# Patient Record
Sex: Female | Born: 1975 | Race: White | Hispanic: No | Marital: Single | Smoking: Current every day smoker
Health system: Southern US, Community
[De-identification: ages and names within clinical notes are randomized; demographics above are authoritative.]

## PROBLEM LIST (undated history)

## (undated) ENCOUNTER — Emergency Department (HOSPITAL_COMMUNITY): Admission: EM | Payer: Self-pay | Source: Home / Self Care

## (undated) DIAGNOSIS — I341 Nonrheumatic mitral (valve) prolapse: Secondary | ICD-10-CM

## (undated) DIAGNOSIS — I4891 Unspecified atrial fibrillation: Secondary | ICD-10-CM

## (undated) DIAGNOSIS — F1910 Other psychoactive substance abuse, uncomplicated: Secondary | ICD-10-CM

## (undated) DIAGNOSIS — M542 Cervicalgia: Secondary | ICD-10-CM

## (undated) DIAGNOSIS — J45909 Unspecified asthma, uncomplicated: Secondary | ICD-10-CM

## (undated) DIAGNOSIS — M549 Dorsalgia, unspecified: Secondary | ICD-10-CM

## (undated) DIAGNOSIS — G8929 Other chronic pain: Secondary | ICD-10-CM

## (undated) DIAGNOSIS — F419 Anxiety disorder, unspecified: Secondary | ICD-10-CM

## (undated) DIAGNOSIS — G43909 Migraine, unspecified, not intractable, without status migrainosus: Secondary | ICD-10-CM

## (undated) DIAGNOSIS — R569 Unspecified convulsions: Secondary | ICD-10-CM

## (undated) DIAGNOSIS — J449 Chronic obstructive pulmonary disease, unspecified: Secondary | ICD-10-CM

## (undated) HISTORY — PX: OTHER SURGICAL HISTORY: SHX169

## (undated) HISTORY — DX: Unspecified convulsions: R56.9

---

## 2001-03-02 ENCOUNTER — Emergency Department (HOSPITAL_COMMUNITY): Admission: EM | Admit: 2001-03-02 | Discharge: 2001-03-02 | Payer: Self-pay | Admitting: *Deleted

## 2002-10-10 ENCOUNTER — Inpatient Hospital Stay (HOSPITAL_COMMUNITY): Admission: RE | Admit: 2002-10-10 | Discharge: 2002-10-12 | Payer: Self-pay | Admitting: Obstetrics and Gynecology

## 2002-10-25 DIAGNOSIS — O4590 Premature separation of placenta, unspecified, unspecified trimester: Secondary | ICD-10-CM

## 2004-02-01 ENCOUNTER — Ambulatory Visit (HOSPITAL_COMMUNITY): Admission: AD | Admit: 2004-02-01 | Discharge: 2004-02-01 | Payer: Self-pay | Admitting: Obstetrics & Gynecology

## 2004-11-14 ENCOUNTER — Encounter: Payer: Self-pay | Admitting: Orthopedic Surgery

## 2004-11-30 ENCOUNTER — Encounter: Payer: Self-pay | Admitting: Orthopedic Surgery

## 2004-12-28 ENCOUNTER — Encounter: Payer: Self-pay | Admitting: Orthopedic Surgery

## 2005-01-28 ENCOUNTER — Encounter: Payer: Self-pay | Admitting: Orthopedic Surgery

## 2005-11-28 ENCOUNTER — Observation Stay (HOSPITAL_COMMUNITY): Admission: EM | Admit: 2005-11-28 | Discharge: 2005-11-29 | Payer: Self-pay | Admitting: Emergency Medicine

## 2005-11-29 ENCOUNTER — Encounter: Payer: Self-pay | Admitting: Obstetrics & Gynecology

## 2006-08-24 ENCOUNTER — Emergency Department (HOSPITAL_COMMUNITY): Admission: EM | Admit: 2006-08-24 | Discharge: 2006-08-24 | Payer: Self-pay | Admitting: Emergency Medicine

## 2007-05-02 ENCOUNTER — Emergency Department (HOSPITAL_COMMUNITY): Admission: EM | Admit: 2007-05-02 | Discharge: 2007-05-02 | Payer: Self-pay | Admitting: Emergency Medicine

## 2007-05-24 ENCOUNTER — Emergency Department (HOSPITAL_COMMUNITY): Admission: EM | Admit: 2007-05-24 | Discharge: 2007-05-24 | Payer: Self-pay | Admitting: Physician Assistant

## 2008-01-27 ENCOUNTER — Emergency Department (HOSPITAL_COMMUNITY): Admission: EM | Admit: 2008-01-27 | Discharge: 2008-01-27 | Payer: Self-pay | Admitting: Emergency Medicine

## 2008-02-16 ENCOUNTER — Encounter: Admission: RE | Admit: 2008-02-16 | Discharge: 2008-02-16 | Payer: Self-pay | Admitting: Neurosurgery

## 2009-06-13 ENCOUNTER — Emergency Department (HOSPITAL_COMMUNITY): Admission: EM | Admit: 2009-06-13 | Discharge: 2009-06-13 | Payer: Self-pay | Admitting: Emergency Medicine

## 2009-08-30 ENCOUNTER — Ambulatory Visit (HOSPITAL_COMMUNITY): Admission: RE | Admit: 2009-08-30 | Discharge: 2009-08-30 | Payer: Self-pay | Admitting: Neurology

## 2009-11-23 ENCOUNTER — Emergency Department (HOSPITAL_COMMUNITY): Admission: EM | Admit: 2009-11-23 | Discharge: 2009-11-23 | Payer: Self-pay | Admitting: Emergency Medicine

## 2009-11-25 ENCOUNTER — Inpatient Hospital Stay (HOSPITAL_COMMUNITY): Admission: AD | Admit: 2009-11-25 | Discharge: 2009-11-25 | Payer: Self-pay | Admitting: Obstetrics & Gynecology

## 2009-12-09 ENCOUNTER — Emergency Department (HOSPITAL_COMMUNITY): Admission: EM | Admit: 2009-12-09 | Discharge: 2009-12-09 | Payer: Self-pay | Admitting: Emergency Medicine

## 2010-09-25 ENCOUNTER — Inpatient Hospital Stay: Payer: Self-pay | Admitting: Obstetrics and Gynecology

## 2011-01-18 LAB — RAPID STREP SCREEN (MED CTR MEBANE ONLY): Streptococcus, Group A Screen (Direct): NEGATIVE

## 2011-03-17 NOTE — Discharge Summary (Signed)
   NAMEOSCEOLA, HOLIAN NO.:  [id number]   MEDICAL RECORD NO.:  [id number]                   PATIENT TYPE:  INP   LOCATION:  A428                                 FACILITY:  [facility]   PHYSICIAN:  Lazaro Arms, M.D.                DATE OF BIRTH:  [birth year]   DATE OF ADMISSION:  10/10/2002  DATE OF DISCHARGE:  10/12/2002                                 DISCHARGE SUMMARY   DISCHARGE DIAGNOSES:  1. Intrauterine pregnancy at [redacted] weeks gestation.  2. Marginal sinus abruption.  3. History of preterm labor.  4. Preterm labor.   HOSPITAL COURSE:  The patient was admitted with some vaginal bleeding and  contractions.  We started her on terbutaline, got laboratories.  She did not  have any significant laboratories in her laboratories.  She does have a  history of preterm labor.  She is not followed by our practice.  She is  followed at [facility].  The bleeding quit, went away to dark spotting.  She received Celestone 12 mg IM x 2.  She was discharged home on terbutaline  2.5 mg q.4h., and follow-up at [facility] was scheduled on October 16, 2002.  She was given instructions and precautions for return prior to that  time.                                               Lazaro Arms, M.D.    Loraine Maple  D:  11/18/2002  T:  11/19/2002  Job:  [id number]

## 2011-03-17 NOTE — Op Note (Signed)
NAME:  Leslie Christian, Leslie Christian NO.:  [id number]   MEDICAL RECORD NO.:  [id number]          PATIENT TYPE:  OIB   LOCATION:  A427                          FACILITY:  [facility]   PHYSICIAN:  Lazaro Arms, M.D.   DATE OF BIRTH:  [birth year]   DATE OF PROCEDURE:  11/29/2005  DATE OF DISCHARGE:                                 OPERATIVE REPORT   PREOPERATIVE DIAGNOSIS:  Missed abortion at 10 weeks.   POSTOPERATIVE DIAGNOSIS:  Missed abortion at 10 weeks.   PROCEDURE:  Cervical dilation with suction and sharp curettage.   SURGEON:  Dr. Despina Hidden.   ANESTHESIA:  Laryngeal mask airway.   FINDINGS:  The patient came to the hospital yesterday evening complaining of  bleeding and lower abdominal cramping. She was known to have an intrauterine  pregnancy. An ultrasound this morning revealed a missed AB at 10 weeks. The  cervix was not opened, and her bleeding had basically stopped. As a result,  she was brought for a D&C. Her uterus was 10-week size. There were no  abnormalities at the time of surgery.   DESCRIPTION OF OPERATION:  The patient was taken to the operating room and  placed in a supine position where she underwent laryngeal mask airway,  placed in dorsal lithotomy position, and prepped and draped in usual sterile  fashion. Grave's speculum was placed. Her cervix was grasped. The cervix was  dilated to allow passage of a 9 curved suction curet. Three passes were made  with suction curet, and a large amount of tissue and blood was returned. A  gentle sharp curettage was then performed. Good uterine cry was obtained in  all areas, and a repeat suction curetting was performed, and all tissue had  been removed. The patient was awakened from anesthesia, taken to the  recovery room in good stable condition. All counts were correct.      Lazaro Arms, M.D.  Electronically Signed     LHE/MEDQ  D:  11/29/2005  T:  11/29/2005  Job:  [id number]

## 2011-08-14 LAB — PREGNANCY, URINE: Preg Test, Ur: NEGATIVE

## 2013-06-17 ENCOUNTER — Emergency Department (HOSPITAL_COMMUNITY)
Admission: EM | Admit: 2013-06-17 | Discharge: 2013-06-18 | Disposition: A | Discharge status: Home / Self Care | Payer: Medicaid Other | Attending: Emergency Medicine | Admitting: Emergency Medicine

## 2013-06-17 ENCOUNTER — Emergency Department (HOSPITAL_COMMUNITY): Payer: Medicaid Other

## 2013-06-17 ENCOUNTER — Encounter (HOSPITAL_COMMUNITY): Payer: Self-pay

## 2013-06-17 DIAGNOSIS — R0602 Shortness of breath: Secondary | ICD-10-CM | POA: Diagnosis present

## 2013-06-17 DIAGNOSIS — T7421XA Adult sexual abuse, confirmed, initial encounter: Secondary | ICD-10-CM | POA: Insufficient documentation

## 2013-06-17 DIAGNOSIS — R072 Precordial pain: Secondary | ICD-10-CM | POA: Insufficient documentation

## 2013-06-17 DIAGNOSIS — Z8679 Personal history of other diseases of the circulatory system: Secondary | ICD-10-CM | POA: Insufficient documentation

## 2013-06-17 DIAGNOSIS — F17200 Nicotine dependence, unspecified, uncomplicated: Secondary | ICD-10-CM | POA: Insufficient documentation

## 2013-06-17 DIAGNOSIS — R079 Chest pain, unspecified: Secondary | ICD-10-CM | POA: Diagnosis present

## 2013-06-17 DIAGNOSIS — IMO0002 Reserved for concepts with insufficient information to code with codable children: Secondary | ICD-10-CM

## 2013-06-17 DIAGNOSIS — R61 Generalized hyperhidrosis: Secondary | ICD-10-CM | POA: Insufficient documentation

## 2013-06-17 DIAGNOSIS — N949 Unspecified condition associated with female genital organs and menstrual cycle: Secondary | ICD-10-CM | POA: Insufficient documentation

## 2013-06-17 DIAGNOSIS — Z91040 Latex allergy status: Secondary | ICD-10-CM | POA: Insufficient documentation

## 2013-06-17 DIAGNOSIS — H538 Other visual disturbances: Secondary | ICD-10-CM | POA: Insufficient documentation

## 2013-06-17 DIAGNOSIS — J441 Chronic obstructive pulmonary disease with (acute) exacerbation: Secondary | ICD-10-CM | POA: Insufficient documentation

## 2013-06-17 HISTORY — DX: Unspecified asthma, uncomplicated: J45.909

## 2013-06-17 HISTORY — DX: Nonrheumatic mitral (valve) prolapse: I34.1

## 2013-06-17 HISTORY — DX: Chronic obstructive pulmonary disease, unspecified: J44.9

## 2013-06-17 HISTORY — DX: Unspecified atrial fibrillation: I48.91

## 2013-06-17 LAB — URINALYSIS W MICROSCOPIC + REFLEX CULTURE
Bilirubin Urine: NEGATIVE
Glucose, UA: NEGATIVE mg/dL
Ketones, ur: NEGATIVE mg/dL
Protein, ur: NEGATIVE mg/dL
Urobilinogen, UA: 0.2 mg/dL (ref 0.0–1.0)

## 2013-06-17 LAB — CBC WITH DIFFERENTIAL/PLATELET
Basophils Relative: 0 % (ref 0–1)
Eosinophils Absolute: 0.1 10*3/uL (ref 0.0–0.7)
Eosinophils Relative: 1 % (ref 0–5)
Hemoglobin: 11.1 g/dL — ABNORMAL LOW (ref 12.0–15.0)
Lymphs Abs: 4.1 10*3/uL — ABNORMAL HIGH (ref 0.7–4.0)
MCH: 29.4 pg (ref 26.0–34.0)
MCHC: 33.6 g/dL (ref 30.0–36.0)
MCV: 87.5 fL (ref 78.0–100.0)
Monocytes Absolute: 0.9 10*3/uL (ref 0.1–1.0)
Platelets: 181 10*3/uL (ref 150–400)
RBC: 3.77 MIL/uL — ABNORMAL LOW (ref 3.87–5.11)

## 2013-06-17 LAB — COMPREHENSIVE METABOLIC PANEL
BUN: 7 mg/dL (ref 6–23)
CO2: 25 mEq/L (ref 19–32)
Chloride: 100 mEq/L (ref 96–112)
Creatinine, Ser: 0.51 mg/dL (ref 0.50–1.10)
GFR calc Af Amer: >90 mL/min (ref >90)
GFR calc non Af Amer: >90 mL/min (ref >90)
Total Bilirubin: <0.1 mg/dL — ABNORMAL LOW (ref 0.3–1.2)

## 2013-06-17 LAB — D-DIMER, QUANTITATIVE: D-Dimer, Quant: 2.38 ug/mL-FEU — ABNORMAL HIGH (ref 0.00–0.48)

## 2013-06-17 MED ORDER — SODIUM CHLORIDE 0.9 % IV BOLUS (SEPSIS)
1000.0000 mL | INTRAVENOUS | Status: AC
  Administered 2013-06-17: 1000 mL via INTRAVENOUS

## 2013-06-17 NOTE — ED Notes (Signed)
Waiting for sane nurse to asses patient

## 2013-06-17 NOTE — ED Notes (Signed)
Pt reports sharp chest pain onset 20-30 minutes ago.

## 2013-06-17 NOTE — Progress Notes (Addendum)
Call from [facility], pt presents with sharp chest pain. [id number]. No complaints of contractions. Pt states baby is breech. 2220 called spoke to RN Brenda FHR reassuring and reactive, may discontinue if ok with EDP
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
## 2013-06-17 NOTE — ED Notes (Signed)
Per Candise Bowens, RN (Rapid Response at [facility])  Pt tracing and baby look good.
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
## 2013-06-18 DIAGNOSIS — IMO0002 Reserved for concepts with insufficient information to code with codable children: Secondary | ICD-10-CM

## 2013-06-18 DIAGNOSIS — R079 Chest pain, unspecified: Secondary | ICD-10-CM | POA: Diagnosis present

## 2013-06-18 DIAGNOSIS — R0602 Shortness of breath: Secondary | ICD-10-CM | POA: Diagnosis present

## 2013-06-18 LAB — WET PREP, GENITAL

## 2013-06-18 MED ORDER — METRONIDAZOLE 500 MG PO TABS
2000.0000 mg | ORAL_TABLET | ORAL | Status: AC
  Administered 2013-06-18: 2000 mg via ORAL
  Filled 2013-06-18 (×2): qty 4

## 2013-06-18 MED ORDER — CEFTRIAXONE SODIUM 250 MG IJ SOLR
INTRAMUSCULAR | Status: AC
  Filled 2013-06-18: qty 250

## 2013-06-18 MED ORDER — CEFTRIAXONE SODIUM 250 MG IJ SOLR
250.0000 mg | INTRAMUSCULAR | Status: AC
  Administered 2013-06-18: 250 mg via INTRAMUSCULAR
  Filled 2013-06-18: qty 250

## 2013-06-18 MED ORDER — AZITHROMYCIN 1 G PO PACK
PACK | ORAL | Status: AC
  Filled 2013-06-18: qty 1

## 2013-06-18 MED ORDER — ACETAMINOPHEN 325 MG PO TABS
650.0000 mg | ORAL_TABLET | Freq: Once | ORAL | Status: AC
  Administered 2013-06-18: 650 mg via ORAL
  Filled 2013-06-18: qty 2

## 2013-06-18 MED ORDER — LIDOCAINE HCL (PF) 1 % IJ SOLN
INTRAMUSCULAR | Status: AC
  Administered 2013-06-18: 01:00:00
  Filled 2013-06-18: qty 5

## 2013-06-18 MED ORDER — AZITHROMYCIN 1 G PO PACK
1.0000 g | PACK | Freq: Once | ORAL | Status: AC
  Administered 2013-06-18: 1 g via ORAL
  Filled 2013-06-18: qty 1
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
## 2013-06-19 LAB — URINE CULTURE: Colony Count: NO GROWTH

## 2013-06-20 ENCOUNTER — Telehealth (HOSPITAL_COMMUNITY): Payer: Self-pay | Admitting: *Deleted

## 2013-06-20 NOTE — ED Notes (Signed)
+   Chlamydia Patient treated with Rocpehin and zithromax.-[facility]

## 2013-06-22 ENCOUNTER — Telehealth (HOSPITAL_COMMUNITY): Payer: Self-pay | Admitting: Emergency Medicine

## 2013-06-22 NOTE — ED Notes (Signed)
+  Chlamydia. Patient treated with Rocephin and Zithromax. [facility] faxed. 

## 2013-06-22 NOTE — ED Notes (Signed)
Patient has +Chlamydia. 

## 2013-07-01 ENCOUNTER — Other Ambulatory Visit: Payer: Self-pay | Admitting: Obstetrics & Gynecology

## 2013-07-01 DIAGNOSIS — O0933 Supervision of pregnancy with insufficient antenatal care, third trimester: Secondary | ICD-10-CM

## 2013-07-03 ENCOUNTER — Ambulatory Visit: Payer: Medicaid Other

## 2013-07-03 ENCOUNTER — Encounter: Payer: Self-pay | Admitting: Advanced Practice Midwife

## 2013-07-07 ENCOUNTER — Other Ambulatory Visit: Payer: Self-pay | Admitting: Obstetrics & Gynecology

## 2013-07-07 ENCOUNTER — Ambulatory Visit (INDEPENDENT_AMBULATORY_CARE_PROVIDER_SITE_OTHER): Payer: Medicaid Other

## 2013-07-07 ENCOUNTER — Encounter (HOSPITAL_COMMUNITY): Payer: Self-pay | Admitting: *Deleted

## 2013-07-07 ENCOUNTER — Inpatient Hospital Stay (HOSPITAL_COMMUNITY)
Admission: AD | Admit: 2013-07-07 | Discharge: 2013-07-07 | Disposition: A | Discharge status: Home / Self Care | Payer: Medicaid Other | Source: Ambulatory Visit | Attending: Obstetrics & Gynecology | Admitting: Obstetrics & Gynecology

## 2013-07-07 DIAGNOSIS — N949 Unspecified condition associated with female genital organs and menstrual cycle: Secondary | ICD-10-CM | POA: Insufficient documentation

## 2013-07-07 DIAGNOSIS — O0933 Supervision of pregnancy with insufficient antenatal care, third trimester: Secondary | ICD-10-CM

## 2013-07-07 DIAGNOSIS — O99891 Other specified diseases and conditions complicating pregnancy: Secondary | ICD-10-CM | POA: Insufficient documentation

## 2013-07-07 DIAGNOSIS — O0930 Supervision of pregnancy with insufficient antenatal care, unspecified trimester: Secondary | ICD-10-CM

## 2013-07-07 DIAGNOSIS — O4100X0 Oligohydramnios, unspecified trimester, not applicable or unspecified: Secondary | ICD-10-CM | POA: Insufficient documentation

## 2013-07-07 DIAGNOSIS — O4103X4 Oligohydramnios, third trimester, fetus 4: Secondary | ICD-10-CM

## 2013-07-07 LAB — WET PREP, GENITAL
Clue Cells Wet Prep HPF POC: NONE SEEN
Trich, Wet Prep: NONE SEEN
Yeast Wet Prep HPF POC: NONE SEEN

## 2013-07-07 MED ORDER — BETAMETHASONE SOD PHOS & ACET 6 (3-3) MG/ML IJ SUSP
12.0000 mg | Freq: Once | INTRAMUSCULAR | Status: AC
  Administered 2013-07-07: 12 mg via INTRAMUSCULAR
  Filled 2013-07-07: qty 2

## 2013-07-07 NOTE — Progress Notes (Signed)
Late to [facility], single, living IUP, frank breech position, MEAS. < DATES, AFI 5 4 cm, c/w oligohydramnios, post. Plac. Gr 1, anatomy screen incomplete, d/t fetal position and oligohydramnios, unable to see face, gender. cx not seen, bilat adn appear wnl, fetus not active at time of scan

## 2013-07-07 NOTE — MAU Note (Signed)
Has leaked off and on, pt thought it was pee.  US showed low fluid, sent in for further eval.

## 2013-07-07 NOTE — MAU Provider Note (Signed)
Chief Complaint:  ? leaking    Leslie Christian is a 37 y.o.  [phone] with IUP at [redacted]w[redacted]d presenting for ? leaking   Pt states that for the past 2 weeks, she has had occasional days with thin vaginal discharge and other days without. She was previously receiving care at an [facility] and today had a NOBV with [place] at [facility]. However, the US showed oligohydramnios with a total fluid volume of 5.4cm. She was then sent to the MAU for further evaluation for possible ROM.   Pt has a hx of 2 previous preterm deliveries. She has not been on 17-OHP with any of her pregnancies nor has she been diagnosed with a shortened cervix.  Her PNC thus far has been uncomplicated per the pt although we have no access to those records today as they have not been scanned in at [facility].    She denies any fevers, chills, nausea, vomiting, ctx, VB. She does endorse +FM.    Menstrual History: OB History   Grav Para Term Preterm Abortions TAB SAB Ect Mult Living   8 3 1 2 4 2 2   3        Patient's last menstrual period was 10/26/2012.      Past Medical History  Diagnosis Date  . Pregnant   . Asthma   . COPD (chronic obstructive pulmonary disease)   . Atrial fibrillation   . Mitral valve prolapse     Past Surgical History  Procedure Laterality Date  . Cesarean section      History reviewed. No pertinent family history.  History  Substance Use Topics  . Smoking status: Current Every Day Smoker  . Smokeless tobacco: Not on file  . Alcohol Use: No      Allergies  Allergen Reactions  . Latex Itching    Prescriptions prior to admission  Medication Sig Dispense Refill  . acetaminophen (TYLENOL) 500 MG tablet Take 1,000 mg by mouth daily as needed for pain.      . Prenatal Vit-Fe Fumarate-FA (PRENATAL MULTIVITAMIN) TABS tablet Take 1 tablet by mouth daily at 12 noon.        Review of Systems - Negative except for what is mentioned in HPI.  Physical Exam  Blood  pressure 127/80, pulse 71, temperature 97.9 F (36.6 C), temperature source Oral, resp. rate 18, height 5\' 4"  (1.626 m), weight 58.968 kg (130 lb), last menstrual period 10/26/2012. GENERAL: Well-developed, well-nourished female in no acute distress.  LUNGS: Clear to auscultation bilaterally.  HEART: Regular rate and rhythm. ABDOMEN: Soft, nontender, nondistended, gravid.  EXTREMITIES: Nontender, no edema, 2+ distal pulses. Cervical Exam: Dilatation 1cm   Effacement 50%   Station -3   SSE: negative pooling, some frothy yellow discharge in the vault  Presentation: breech on [place] today  FHT:  Baseline rate 130 bpm   Variability moderate  Accelerations present   Decelerations none Contractions: none    Labs: No results found for this or any previous visit (from the past 24 hour(s)).  Imaging Studies:  US Ob Detail + 14 Wk  07/07/2013    DETAILED SECOND TRIMESTER SONOGRAM  Leslie Christian is in the office for detailed second trimester sonogram.  She is a 37 y.o. year old G1P0 with Estimated Date of Delivery: 08/21/13  by best clinical estimate now at  [redacted]w[redacted]d weeks gestation. Thus far the  pregnancy has been uncomplicated.   GESTATION: SINGLETON  PRESENTATION: breech frank  FETAL ACTIVITY:  Heart rate         141          The fetus is inactive during scan time AMNIOTIC FLUID: The amniotic fluid volume is  abnormal - all pockets less than 2cm, 5.4   cm.  PLACENTA LOCALIZATION:  posterior GRADE 1  CERVIX:  Not seen  ADNEXA: The ovaries are normal.adn only   GESTATIONAL AGE AND  BIOMETRICS:  Gestational criteria: Estimated Date of Delivery: 08/21/13 by best  clinical estimate now at [redacted]w[redacted]d  Previous Scans:0              BIPARIETAL DIAMETER           7.6 cm         30.3 weeks  HEAD CIRCUMFERENCE           28.2 cm         31+0 weeks  ABDOMINAL CIRCUMFERENCE           29.9 cm         33+6 weeks  FEMUR LENGTH           6.3 cm         32+3 weeks                                                           AVERAGE  EGA(BY THIS SCAN):   31+6 weeks                                                 ESTIMATED FETAL WEIGHT:        2228  grams, 45 %     ANATOMICAL SURVEY                                                                             COMMENTS CEREBRAL VENTRICLES yes normal   CHOROID PLEXUS yes normal   CEREBELLUM yes normal   CISTERNA MAGNA yes normal   NUCHAL REGION yes normal   ORBITS yes normal   NASAL BONE no No seen   NOSE/LIP no Not seen   FACIAL PROFILE no Not seen   4 CHAMBERED HEART yes normal   OUTFLOW TRACTS yes normal   DIAPHRAGM yes normal   STOMACH yes normal   RENAL REGION yes normal   BLADDER yes normal   CORD INSERTION no No seen   3 VESSEL CORD yes normal   SPINE yes normal   ARMS/HANDS yes normal   LEGS/FEET yes normal   GENITALIA no Not seen  not seen        SUSPECTED ABNORMALITIES:  oligohydramnios  QUALITY OF SCAN: suboptimal   TECHNICIAN COMMENTS:  Late to [facility], single, living IUP, frank breech position, MEAS. < DATES, AFI  5 4 cm, c/w oligohydramnios, post. Plac. Gr 1, anatomy screen incomplete,  d/t fetal position and oligohydramnios, unable to see face, gender. cx not  seen, bilat adn appear wnl.      A copy of this report including all images has been saved and backed up to  a second source for retrieval if needed. All measures and details of the  anatomical scan, placentation, fluid volume and pelvic anatomy are  contained in that report.  Reggy Eye 07/07/2013 10:30 AM          Dg Chest Port 1 View  06/17/2013   *RADIOLOGY REPORT*  Clinical Data: Sudden onset of chest pain for several hours tonight.  The patient is [redacted] weeks pregnant.  PORTABLE CHEST - 1 VIEW  Comparison: 12/09/2009  Findings: Pulmonary hyperinflation with peribronchial thickening suggesting changes of asthma versus reactive airways disease. Costophrenic angles are not included within the field of view.  No focal consolidation.  No pneumothorax.  Normal heart size and pulmonary vascularity.  IMPRESSION: Pulmonary  hyperinflation with peribronchial thickening suggesting asthma or reactive airways disease.  No focal consolidation.   Original Report Authenticated By: Burman Nieves, M.D.    Assessment: Leslie Christian is  37 y.o. [phone] at [redacted]w[redacted]d presents with ? leaking  .  Plan:  1) oligohydramnios - negative fern and pool on SSE - amnisure collected and sent and negative - reassurance given that this was not rupture  2) hx of PTD - given hx of preterm delivery and currently soft, middle cervix with already a degree of effacement at 33 weeks, will give BMZ.  - BMZ #1 today at 1545 and second one set up at [facility] tomorrow at 1530 - could consider prometrium although will leave this option to the doctors at [facility] after they can review her records.   3) FWB - cat I tracing  New OB appt made again for the pt at [facility] on 07/10/13.  She was notified and will f/u then.    Astoria Condon L 9/8/20143:08 PM

## 2013-07-08 ENCOUNTER — Ambulatory Visit (INDEPENDENT_AMBULATORY_CARE_PROVIDER_SITE_OTHER): Payer: Medicaid Other | Admitting: Adult Health

## 2013-07-08 ENCOUNTER — Encounter: Payer: Self-pay | Admitting: Adult Health

## 2013-07-08 VITALS — BP 120/70 | Ht 64.0 in | Wt 132.0 lb

## 2013-07-08 DIAGNOSIS — Z331 Pregnant state, incidental: Secondary | ICD-10-CM

## 2013-07-08 DIAGNOSIS — O4700 False labor before 37 completed weeks of gestation, unspecified trimester: Secondary | ICD-10-CM

## 2013-07-08 DIAGNOSIS — Z1389 Encounter for screening for other disorder: Secondary | ICD-10-CM

## 2013-07-08 LAB — GC/CHLAMYDIA PROBE AMP: GC Probe RNA: NEGATIVE

## 2013-07-08 LAB — POCT URINALYSIS DIPSTICK
Blood, UA: NEGATIVE
Glucose, UA: NEGATIVE
Protein, UA: NEGATIVE

## 2013-07-08 LAB — OB RESULTS CONSOLE GC/CHLAMYDIA: Gonorrhea: NEGATIVE

## 2013-07-08 MED ORDER — BETAMETHASONE SOD PHOS & ACET 6 (3-3) MG/ML IJ SUSP
12.0000 mg | Freq: Once | INTRAMUSCULAR | Status: AC
  Administered 2013-07-08: 12 mg via INTRAMUSCULAR

## 2013-07-08 NOTE — Progress Notes (Signed)
Patient ID: Leslie Christian, female   DOB: January 16, 1976, 37 y.o.   MRN: [id number] Pt here today for 2nd BMZ shot. Pt states she has good fetal movement. Pt denies any bleeding or swelling. Pt denies any problems or concerns at this time. Pt given BMZ in right hip.

## 2013-07-09 ENCOUNTER — Inpatient Hospital Stay (HOSPITAL_COMMUNITY): Payer: Medicaid Other | Admitting: Anesthesiology

## 2013-07-09 ENCOUNTER — Encounter (HOSPITAL_COMMUNITY)
Admission: AD | Disposition: A | Discharge status: Home / Self Care | Payer: Self-pay | Source: Ambulatory Visit | Attending: Family Medicine

## 2013-07-09 ENCOUNTER — Encounter (HOSPITAL_COMMUNITY): Payer: Self-pay

## 2013-07-09 ENCOUNTER — Inpatient Hospital Stay (HOSPITAL_COMMUNITY): Payer: Medicaid Other

## 2013-07-09 ENCOUNTER — Inpatient Hospital Stay (HOSPITAL_COMMUNITY)
Admission: AD | Admit: 2013-07-09 | Discharge: 2013-07-12 | DRG: 765 | Disposition: A | Discharge status: Home / Self Care | Payer: Medicaid Other | Source: Ambulatory Visit | Attending: Family Medicine | Admitting: Family Medicine

## 2013-07-09 ENCOUNTER — Encounter (HOSPITAL_COMMUNITY): Payer: Self-pay | Admitting: Anesthesiology

## 2013-07-09 DIAGNOSIS — O9942 Diseases of the circulatory system complicating childbirth: Secondary | ICD-10-CM

## 2013-07-09 DIAGNOSIS — O0930 Supervision of pregnancy with insufficient antenatal care, unspecified trimester: Secondary | ICD-10-CM

## 2013-07-09 DIAGNOSIS — Z8759 Personal history of other complications of pregnancy, childbirth and the puerperium: Secondary | ICD-10-CM | POA: Insufficient documentation

## 2013-07-09 DIAGNOSIS — O360990 Maternal care for other rhesus isoimmunization, unspecified trimester, not applicable or unspecified: Secondary | ICD-10-CM | POA: Diagnosis present

## 2013-07-09 DIAGNOSIS — O0933 Supervision of pregnancy with insufficient antenatal care, third trimester: Secondary | ICD-10-CM

## 2013-07-09 DIAGNOSIS — O321XX0 Maternal care for breech presentation, not applicable or unspecified: Principal | ICD-10-CM | POA: Diagnosis present

## 2013-07-09 DIAGNOSIS — I2510 Atherosclerotic heart disease of native coronary artery without angina pectoris: Secondary | ICD-10-CM

## 2013-07-09 DIAGNOSIS — O42913 Preterm premature rupture of membranes, unspecified as to length of time between rupture and onset of labor, third trimester: Secondary | ICD-10-CM | POA: Diagnosis present

## 2013-07-09 DIAGNOSIS — O26899 Other specified pregnancy related conditions, unspecified trimester: Secondary | ICD-10-CM | POA: Diagnosis present

## 2013-07-09 DIAGNOSIS — I059 Rheumatic mitral valve disease, unspecified: Secondary | ICD-10-CM | POA: Diagnosis present

## 2013-07-09 DIAGNOSIS — O09899 Supervision of other high risk pregnancies, unspecified trimester: Secondary | ICD-10-CM

## 2013-07-09 DIAGNOSIS — O4100X0 Oligohydramnios, unspecified trimester, not applicable or unspecified: Secondary | ICD-10-CM | POA: Diagnosis present

## 2013-07-09 DIAGNOSIS — O34219 Maternal care for unspecified type scar from previous cesarean delivery: Secondary | ICD-10-CM | POA: Diagnosis present

## 2013-07-09 DIAGNOSIS — O99892 Other specified diseases and conditions complicating childbirth: Secondary | ICD-10-CM | POA: Diagnosis present

## 2013-07-09 DIAGNOSIS — A749 Chlamydial infection, unspecified: Secondary | ICD-10-CM | POA: Insufficient documentation

## 2013-07-09 DIAGNOSIS — Z22330 Carrier of Group B streptococcus: Secondary | ICD-10-CM

## 2013-07-09 DIAGNOSIS — O4290 Premature rupture of membranes, unspecified as to length of time between rupture and onset of labor, unspecified weeks of gestation: Secondary | ICD-10-CM | POA: Diagnosis present

## 2013-07-09 DIAGNOSIS — Z87898 Personal history of other specified conditions: Secondary | ICD-10-CM | POA: Insufficient documentation

## 2013-07-09 DIAGNOSIS — L299 Pruritus, unspecified: Secondary | ICD-10-CM | POA: Diagnosis present

## 2013-07-09 DIAGNOSIS — O99334 Smoking (tobacco) complicating childbirth: Secondary | ICD-10-CM | POA: Diagnosis present

## 2013-07-09 DIAGNOSIS — F1491 Cocaine use, unspecified, in remission: Secondary | ICD-10-CM | POA: Insufficient documentation

## 2013-07-09 DIAGNOSIS — Z3490 Encounter for supervision of normal pregnancy, unspecified, unspecified trimester: Secondary | ICD-10-CM

## 2013-07-09 LAB — DIFFERENTIAL
Eosinophils Relative: 0 % (ref 0–5)
Lymphocytes Relative: 17 % (ref 12–46)
Lymphs Abs: 2.8 10*3/uL (ref 0.7–4.0)
Monocytes Absolute: 0.7 10*3/uL (ref 0.1–1.0)
Monocytes Relative: 4 % (ref 3–12)

## 2013-07-09 LAB — TYPE AND SCREEN: ABO/RH(D): A NEG

## 2013-07-09 LAB — CBC
HCT: 32.8 % — ABNORMAL LOW (ref 36.0–46.0)
Hemoglobin: 11.1 g/dL — ABNORMAL LOW (ref 12.0–15.0)
MCH: 29.4 pg (ref 26.0–34.0)
MCV: 86.8 fL (ref 78.0–100.0)
RBC: 3.78 MIL/uL — ABNORMAL LOW (ref 3.87–5.11)
WBC: 16.7 10*3/uL — ABNORMAL HIGH (ref 4.0–10.5)

## 2013-07-09 LAB — RAPID URINE DRUG SCREEN, HOSP PERFORMED
Amphetamines: NOT DETECTED
Cocaine: NOT DETECTED
Opiates: NOT DETECTED
Tetrahydrocannabinol: NOT DETECTED

## 2013-07-09 LAB — URINALYSIS, ROUTINE W REFLEX MICROSCOPIC
Glucose, UA: NEGATIVE mg/dL
Protein, ur: NEGATIVE mg/dL

## 2013-07-09 LAB — RAPID HIV SCREEN (WH-MAU): Rapid HIV Screen: NONREACTIVE

## 2013-07-09 LAB — URINE MICROSCOPIC-ADD ON

## 2013-07-09 LAB — GROUP B STREP BY PCR: Group B strep by PCR: POSITIVE — AB

## 2013-07-09 LAB — ETHANOL: Alcohol, Ethyl (B): <11 mg/dL (ref 0–11)

## 2013-07-09 LAB — ABO/RH: ABO/RH(D): A NEG

## 2013-07-09 LAB — OB RESULTS CONSOLE HIV ANTIBODY (ROUTINE TESTING): HIV: NONREACTIVE

## 2013-07-09 SURGERY — Surgical Case
Anesthesia: Spinal | Site: Abdomen | Wound class: Clean Contaminated

## 2013-07-09 MED ORDER — ZOLPIDEM TARTRATE 5 MG PO TABS
5.0000 mg | ORAL_TABLET | Freq: Every evening | ORAL | Status: DC | PRN
  Administered 2013-07-10 – 2013-07-12 (×3): 5 mg via ORAL
  Filled 2013-07-09 (×3): qty 1

## 2013-07-09 MED ORDER — MENTHOL 3 MG MT LOZG: 1.0000 | LOZENGE | OROMUCOSAL | Status: DC | PRN

## 2013-07-09 MED ORDER — LANOLIN HYDROUS EX OINT: 1.0000 "application " | TOPICAL_OINTMENT | CUTANEOUS | Status: DC | PRN

## 2013-07-09 MED ORDER — CEFAZOLIN SODIUM-DEXTROSE 2-3 GM-% IV SOLR
2.0000 g | Freq: Once | INTRAVENOUS | Status: AC
  Administered 2013-07-09: 2 g via INTRAVENOUS
  Filled 2013-07-09: qty 50

## 2013-07-09 MED ORDER — MEPERIDINE HCL 25 MG/ML IJ SOLN: 6.2500 mg | INTRAMUSCULAR | Status: DC | PRN

## 2013-07-09 MED ORDER — DOCUSATE SODIUM 100 MG PO CAPS
100.0000 mg | ORAL_CAPSULE | Freq: Every day | ORAL | Status: DC
  Filled 2013-07-09: qty 1

## 2013-07-09 MED ORDER — MORPHINE SULFATE (PF) 0.5 MG/ML IJ SOLN
INTRAMUSCULAR | Status: DC | PRN
  Administered 2013-07-09: 200 ug via INTRATHECAL

## 2013-07-09 MED ORDER — PROMETHAZINE HCL 25 MG/ML IJ SOLN: 6.2500 mg | INTRAMUSCULAR | Status: DC | PRN

## 2013-07-09 MED ORDER — ONDANSETRON HCL 4 MG/2ML IJ SOLN: 4.0000 mg | INTRAMUSCULAR | Status: DC | PRN

## 2013-07-09 MED ORDER — MEPERIDINE HCL 25 MG/ML IJ SOLN
INTRAMUSCULAR | Status: DC | PRN
  Administered 2013-07-09 (×2): 12.5 mg via INTRAVENOUS

## 2013-07-09 MED ORDER — DIPHENHYDRAMINE HCL 50 MG/ML IJ SOLN
25.0000 mg | INTRAMUSCULAR | Status: DC | PRN
  Administered 2013-07-09: 25 mg via INTRAMUSCULAR

## 2013-07-09 MED ORDER — SENNOSIDES-DOCUSATE SODIUM 8.6-50 MG PO TABS
2.0000 | ORAL_TABLET | Freq: Every day | ORAL | Status: DC
  Administered 2013-07-10 – 2013-07-11 (×2): 2 via ORAL

## 2013-07-09 MED ORDER — FENTANYL CITRATE 0.05 MG/ML IJ SOLN
INTRAMUSCULAR | Status: DC | PRN
  Administered 2013-07-09: 25 ug via INTRATHECAL

## 2013-07-09 MED ORDER — SODIUM CHLORIDE 0.9 % IV SOLN
250.0000 mg | Freq: Four times a day (QID) | INTRAVENOUS | Status: DC
  Filled 2013-07-09 (×2): qty 250

## 2013-07-09 MED ORDER — KETOROLAC TROMETHAMINE 60 MG/2ML IM SOLN
60.0000 mg | Freq: Once | INTRAMUSCULAR | Status: AC | PRN
  Administered 2013-07-09: 60 mg via INTRAMUSCULAR

## 2013-07-09 MED ORDER — ONDANSETRON HCL 4 MG PO TABS: 4.0000 mg | ORAL_TABLET | ORAL | Status: DC | PRN

## 2013-07-09 MED ORDER — WITCH HAZEL-GLYCERIN EX PADS: 1.0000 "application " | MEDICATED_PAD | CUTANEOUS | Status: DC | PRN

## 2013-07-09 MED ORDER — LACTATED RINGERS IV SOLN
INTRAVENOUS | Status: DC | PRN
  Administered 2013-07-09 (×2): via INTRAVENOUS

## 2013-07-09 MED ORDER — PRENATAL MULTIVITAMIN CH
1.0000 | ORAL_TABLET | Freq: Every day | ORAL | Status: DC
  Filled 2013-07-09: qty 1

## 2013-07-09 MED ORDER — ERYTHROMYCIN BASE 250 MG PO TABS: 250.0000 mg | ORAL_TABLET | Freq: Four times a day (QID) | ORAL | Status: DC

## 2013-07-09 MED ORDER — NALOXONE HCL 0.4 MG/ML IJ SOLN: 0.4000 mg | INTRAMUSCULAR | Status: DC | PRN

## 2013-07-09 MED ORDER — BUPIVACAINE IN DEXTROSE 0.75-8.25 % IT SOLN
INTRATHECAL | Status: DC | PRN
  Administered 2013-07-09: 9 mg via INTRATHECAL

## 2013-07-09 MED ORDER — SIMETHICONE 80 MG PO CHEW: 80.0000 mg | CHEWABLE_TABLET | ORAL | Status: DC | PRN

## 2013-07-09 MED ORDER — DIPHENHYDRAMINE HCL 25 MG PO CAPS
25.0000 mg | ORAL_CAPSULE | ORAL | Status: DC | PRN
  Administered 2013-07-09 – 2013-07-10 (×3): 25 mg via ORAL
  Filled 2013-07-09 (×5): qty 1

## 2013-07-09 MED ORDER — NICOTINE 7 MG/24HR TD PT24
7.0000 mg | MEDICATED_PATCH | Freq: Every day | TRANSDERMAL | Status: DC | PRN
  Filled 2013-07-09: qty 1

## 2013-07-09 MED ORDER — KETOROLAC TROMETHAMINE 30 MG/ML IJ SOLN
30.0000 mg | Freq: Four times a day (QID) | INTRAMUSCULAR | Status: AC | PRN
  Administered 2013-07-09: 30 mg via INTRAVENOUS

## 2013-07-09 MED ORDER — SCOPOLAMINE 1 MG/3DAYS TD PT72
1.0000 | MEDICATED_PATCH | Freq: Once | TRANSDERMAL | Status: AC
  Administered 2013-07-09: 1.5 mg via TRANSDERMAL

## 2013-07-09 MED ORDER — FAMOTIDINE IN NACL 20-0.9 MG/50ML-% IV SOLN
20.0000 mg | Freq: Once | INTRAVENOUS | Status: AC
  Administered 2013-07-09: 20 mg via INTRAVENOUS
  Filled 2013-07-09: qty 50

## 2013-07-09 MED ORDER — SODIUM CHLORIDE 0.9 % IJ SOLN: 3.0000 mL | INTRAMUSCULAR | Status: DC | PRN

## 2013-07-09 MED ORDER — DEXTROSE IN LACTATED RINGERS 5 % IV SOLN
INTRAVENOUS | Status: DC
  Administered 2013-07-09 (×2): via INTRAVENOUS

## 2013-07-09 MED ORDER — IBUPROFEN 600 MG PO TABS
600.0000 mg | ORAL_TABLET | Freq: Four times a day (QID) | ORAL | Status: DC
  Administered 2013-07-09 – 2013-07-12 (×11): 600 mg via ORAL
  Filled 2013-07-09 (×11): qty 1

## 2013-07-09 MED ORDER — HYDROMORPHONE HCL PF 1 MG/ML IJ SOLN: 0.2500 mg | INTRAMUSCULAR | Status: DC | PRN

## 2013-07-09 MED ORDER — METOCLOPRAMIDE HCL 5 MG/ML IJ SOLN: 10.0000 mg | Freq: Three times a day (TID) | INTRAMUSCULAR | Status: DC | PRN

## 2013-07-09 MED ORDER — LACTATED RINGERS IV BOLUS (SEPSIS)
1000.0000 mL | Freq: Once | INTRAVENOUS | Status: AC
  Administered 2013-07-09: 1000 mL via INTRAVENOUS

## 2013-07-09 MED ORDER — PNEUMOCOCCAL VAC POLYVALENT 25 MCG/0.5ML IJ INJ
0.5000 mL | INJECTION | INTRAMUSCULAR | Status: AC
  Administered 2013-07-11: 0.5 mL via INTRAMUSCULAR
  Filled 2013-07-09: qty 0.5

## 2013-07-09 MED ORDER — BUPIVACAINE HCL (PF) 0.25 % IJ SOLN
INTRAMUSCULAR | Status: DC | PRN
  Administered 2013-07-09: 30 mL

## 2013-07-09 MED ORDER — CALCIUM CARBONATE ANTACID 500 MG PO CHEW
2.0000 | CHEWABLE_TABLET | ORAL | Status: DC | PRN
  Filled 2013-07-09: qty 2

## 2013-07-09 MED ORDER — SCOPOLAMINE 1 MG/3DAYS TD PT72
MEDICATED_PATCH | TRANSDERMAL | Status: AC
  Administered 2013-07-09: 1.5 mg via TRANSDERMAL
  Filled 2013-07-09: qty 1

## 2013-07-09 MED ORDER — MEASLES, MUMPS & RUBELLA VAC ~~LOC~~ INJ: 0.5000 mL | INJECTION | Freq: Once | SUBCUTANEOUS | Status: DC

## 2013-07-09 MED ORDER — SODIUM CHLORIDE 0.9 % IV SOLN
2.0000 g | Freq: Four times a day (QID) | INTRAVENOUS | Status: DC
  Filled 2013-07-09 (×3): qty 2000

## 2013-07-09 MED ORDER — TETANUS-DIPHTH-ACELL PERTUSSIS 5-2.5-18.5 LF-MCG/0.5 IM SUSP
0.5000 mL | Freq: Once | INTRAMUSCULAR | Status: AC
  Administered 2013-07-10: 0.5 mL via INTRAMUSCULAR

## 2013-07-09 MED ORDER — OXYCODONE-ACETAMINOPHEN 5-325 MG PO TABS
1.0000 | ORAL_TABLET | ORAL | Status: DC | PRN
  Administered 2013-07-09 – 2013-07-12 (×13): 2 via ORAL
  Filled 2013-07-09 (×13): qty 2

## 2013-07-09 MED ORDER — DIPHENHYDRAMINE HCL 50 MG/ML IJ SOLN: 12.5000 mg | INTRAMUSCULAR | Status: DC | PRN

## 2013-07-09 MED ORDER — DIBUCAINE 1 % RE OINT: 1.0000 "application " | TOPICAL_OINTMENT | RECTAL | Status: DC | PRN

## 2013-07-09 MED ORDER — KETOROLAC TROMETHAMINE 30 MG/ML IJ SOLN: 15.0000 mg | Freq: Once | INTRAMUSCULAR | Status: DC | PRN

## 2013-07-09 MED ORDER — OXYTOCIN 40 UNITS IN LACTATED RINGERS INFUSION - SIMPLE MED: 62.5000 mL/h | INTRAVENOUS | Status: AC

## 2013-07-09 MED ORDER — ACETAMINOPHEN 325 MG PO TABS: 650.0000 mg | ORAL_TABLET | ORAL | Status: DC | PRN

## 2013-07-09 MED ORDER — DIPHENHYDRAMINE HCL 25 MG PO CAPS
25.0000 mg | ORAL_CAPSULE | Freq: Four times a day (QID) | ORAL | Status: DC | PRN
  Administered 2013-07-10 – 2013-07-12 (×6): 25 mg via ORAL
  Filled 2013-07-09 (×5): qty 1

## 2013-07-09 MED ORDER — KETOROLAC TROMETHAMINE 30 MG/ML IJ SOLN
30.0000 mg | Freq: Four times a day (QID) | INTRAMUSCULAR | Status: AC | PRN
  Filled 2013-07-09: qty 1

## 2013-07-09 MED ORDER — NALBUPHINE HCL 10 MG/ML IJ SOLN
5.0000 mg | INTRAMUSCULAR | Status: DC | PRN
  Administered 2013-07-09: 10 mg via INTRAVENOUS
  Filled 2013-07-09 (×2): qty 1

## 2013-07-09 MED ORDER — ZOLPIDEM TARTRATE 5 MG PO TABS: 5.0000 mg | ORAL_TABLET | Freq: Every evening | ORAL | Status: DC | PRN

## 2013-07-09 MED ORDER — ONDANSETRON HCL 4 MG/2ML IJ SOLN
INTRAMUSCULAR | Status: DC | PRN
  Administered 2013-07-09: 4 mg via INTRAVENOUS

## 2013-07-09 MED ORDER — AMOXICILLIN 500 MG PO CAPS: 500.0000 mg | ORAL_CAPSULE | Freq: Three times a day (TID) | ORAL | Status: DC

## 2013-07-09 MED ORDER — NALBUPHINE HCL 10 MG/ML IJ SOLN
5.0000 mg | INTRAMUSCULAR | Status: DC | PRN
  Filled 2013-07-09: qty 1

## 2013-07-09 MED ORDER — OXYTOCIN 10 UNIT/ML IJ SOLN
40.0000 [IU] | INTRAVENOUS | Status: DC | PRN
  Administered 2013-07-09: 40 [IU] via INTRAVENOUS

## 2013-07-09 MED ORDER — DEXTROSE 5 % IV SOLN
1.0000 ug/kg/h | INTRAVENOUS | Status: DC | PRN
  Filled 2013-07-09: qty 2

## 2013-07-09 MED ORDER — KETOROLAC TROMETHAMINE 60 MG/2ML IM SOLN
INTRAMUSCULAR | Status: AC
  Administered 2013-07-09: 60 mg via INTRAMUSCULAR
  Filled 2013-07-09: qty 2

## 2013-07-09 MED ORDER — DIPHENHYDRAMINE HCL 50 MG/ML IJ SOLN
INTRAMUSCULAR | Status: AC
  Administered 2013-07-09: 25 mg via INTRAMUSCULAR
  Filled 2013-07-09: qty 1

## 2013-07-09 MED ORDER — SIMETHICONE 80 MG PO CHEW
80.0000 mg | CHEWABLE_TABLET | Freq: Three times a day (TID) | ORAL | Status: DC
  Administered 2013-07-09 – 2013-07-12 (×10): 80 mg via ORAL

## 2013-07-09 MED ORDER — PRENATAL MULTIVITAMIN CH
1.0000 | ORAL_TABLET | Freq: Every day | ORAL | Status: DC
  Administered 2013-07-09 – 2013-07-12 (×4): 1 via ORAL
  Filled 2013-07-09 (×4): qty 1

## 2013-07-09 MED ORDER — CITRIC ACID-SODIUM CITRATE 334-500 MG/5ML PO SOLN
30.0000 mL | Freq: Once | ORAL | Status: AC
  Administered 2013-07-09: 30 mL via ORAL
  Filled 2013-07-09: qty 15

## 2013-07-09 MED ORDER — ONDANSETRON HCL 4 MG/2ML IJ SOLN: 4.0000 mg | Freq: Three times a day (TID) | INTRAMUSCULAR | Status: DC | PRN

## 2013-07-09 SURGICAL SUPPLY — 34 items
CLAMP CORD UMBIL (MISCELLANEOUS) IMPLANT
CLEANER TIP ELECTROSURG 2X2 (MISCELLANEOUS) ×2 IMPLANT
CLOTH BEACON ORANGE TIMEOUT ST (SAFETY) ×2 IMPLANT
DEVICE BLD TRNS LUER ATTCH (MISCELLANEOUS) ×2 IMPLANT
DRAPE LG THREE QUARTER DISP (DRAPES) ×4 IMPLANT
DRSG OPSITE POSTOP 4X10 (GAUZE/BANDAGES/DRESSINGS) ×2 IMPLANT
DURAPREP 26ML APPLICATOR (WOUND CARE) ×2 IMPLANT
ELECT REM PT RETURN 9FT ADLT (ELECTROSURGICAL) ×2
ELECTRODE REM PT RTRN 9FT ADLT (ELECTROSURGICAL) ×1 IMPLANT
EXTRACTOR VACUUM M CUP 4 TUBE (SUCTIONS) IMPLANT
GLOVE BIOGEL PI IND STRL 7.0 (GLOVE) ×1 IMPLANT
GLOVE BIOGEL PI INDICATOR 7.0 (GLOVE) ×1
GLOVE ECLIPSE 7.0 STRL STRAW (GLOVE) ×4 IMPLANT
GOWN PREVENTION PLUS XLARGE (GOWN DISPOSABLE) ×2 IMPLANT
GOWN STRL REIN XL XLG (GOWN DISPOSABLE) ×2 IMPLANT
KIT ABG SYR 3ML LUER SLIP (SYRINGE) IMPLANT
NDL HYPO 25X5/8 SAFETYGLIDE (NEEDLE) IMPLANT
NEEDLE HYPO 22GX1.5 SAFETY (NEEDLE) ×2 IMPLANT
NEEDLE HYPO 25X5/8 SAFETYGLIDE (NEEDLE) IMPLANT
NS IRRIG 1000ML POUR BTL (IV SOLUTION) ×2 IMPLANT
PACK C SECTION WH (CUSTOM PROCEDURE TRAY) ×2 IMPLANT
PAD ABD 7.5X8 STRL (GAUZE/BANDAGES/DRESSINGS) ×1 IMPLANT
PAD OB MATERNITY 4.3X12.25 (PERSONAL CARE ITEMS) ×2 IMPLANT
PENCIL BUTTON HOLSTER BLD 10FT (ELECTRODE) ×2 IMPLANT
RTRCTR C-SECT PINK 25CM LRG (MISCELLANEOUS) ×1 IMPLANT
STAPLER VISISTAT 35W (STAPLE) IMPLANT
SUT VIC AB 0 CTX 36 (SUTURE) ×6
SUT VIC AB 0 CTX36XBRD ANBCTRL (SUTURE) ×3 IMPLANT
SUT VIC AB 4-0 KS 27 (SUTURE) IMPLANT
SYR 30ML LL (SYRINGE) ×2 IMPLANT
TAPE CLOTH SURG 4X10 WHT LF (GAUZE/BANDAGES/DRESSINGS) ×1 IMPLANT
TOWEL OR 17X24 6PK STRL BLUE (TOWEL DISPOSABLE) ×2 IMPLANT
TRAY FOLEY CATH 14FR (SET/KITS/TRAYS/PACK) ×2 IMPLANT
WATER STERILE IRR 1000ML POUR (IV SOLUTION) ×1 IMPLANT

## 2013-07-09 NOTE — Anesthesia Procedure Notes (Signed)
Spinal  Patient location during procedure: OR Start time: 07/09/2013 5:41 AM End time: 07/09/2013 5:43 AM Staffing Anesthesiologist: Sandrea Hughs Performed by: anesthesiologist  Preanesthetic Checklist Completed: patient identified, surgical consent, pre-op evaluation, timeout performed, IV checked, risks and benefits discussed and monitors and equipment checked Spinal Block Patient position: sitting Prep: DuraPrep Patient monitoring: heart rate, cardiac monitor, continuous pulse ox and blood pressure Approach: midline Location: L3-4 Injection technique: single-shot Needle Needle type: Pencan  Needle gauge: 24 G Needle length: 9 cm Needle insertion depth: 5 cm Assessment Sensory level: T4

## 2013-07-09 NOTE — H&P (Signed)
Chart reviewed and agree with management and plan. By earliest U/S, she appears to be 34 6/7 wks, ROM and MSF and breech with prior C-section.  She is latex allergic and has h/o substance abuse, including alcohol use earlier this week and + cocaine x 2 this pregnancy. Risks include but are not limited to bleeding, infection, injury to surrounding structures, including bowel, bladder and ureters, blood clots, and death.  Likelihood of success is high.
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
## 2013-07-09 NOTE — Progress Notes (Signed)
Received call that patient would like a nicotine patch. Patient smokes 1ppd. I went up to see the patient and counseled her on the effect on nicotine on breast milk and delivery to the infant. She is aware of the possible complications, including SIDS, and has decided to not use the nicotine patch at this time. I ordered nicotine patch 7mg  daily prn for nicotine cravings if she changes her mind.

## 2013-07-09 NOTE — H&P (Signed)
Chart reviewed and agree with management and plan.  

## 2013-07-09 NOTE — MAU Note (Signed)
Pt states ROM at 1130pm. States the color was clear but turned greenish. UC every 15 mins. States good FM.

## 2013-07-09 NOTE — Anesthesia Postprocedure Evaluation (Signed)
  Anesthesia Post-op Note  Patient: Leslie Christian  Procedure(s) Performed: Procedure(s): CESAREAN SECTION (N/A)  Patient Location: Women's Unit  Anesthesia Type:Spinal  Level of Consciousness: awake, alert , oriented and patient cooperative  Airway and Oxygen Therapy: Patient Spontanous Breathing  Post-op Pain: moderate per patient; received  pain medicaition from floor nurse approximately 1 our ago; nurse notified of patient request for additional medication.  Post-op Assessment: Patient's Cardiovascular Status Stable, Respiratory Function Stable, Patent Airway, No signs of Nausea or vomiting, Adequate PO intake and Pain level controlled  Post-op Vital Signs: Reviewed and stable  Complications: No apparent anesthesia complications

## 2013-07-09 NOTE — Lactation Note (Signed)
This note was copied from the chart of Leslie Christian. Lactation Consultation Note   Follow up consult with this mom of a 34 6/[redacted] week gestation baby, in the nicu, now 11 hours post partum. Mom admitts to smoking marijuana a couple of days prior to the baby being born. Mom's urine was negative for any drugs.  Neonatologist Dr. Algernon Huxley suggested I start mom pumping with DEP, and have mom pump and dump for 24 hours, which mom is fine with. i had mom begin pumping in premie settting, and showed her how to hand express. She was able to express a few drops of colostrum, which mom applied to her nipples. I did teaching with mom on providing breast milk for her baby, from the NICU book on EBM in the NICU. Hand expression taught, and mom return demonstrated with good technique. Mom is in a lot of pain, and I told her to do her best with pumping tonight, but  Self care and sleep is important for now. i told her I would follow up with her tomorrow.   Patient Name: Leslie Stephanye Finnicum [id number] Date: 07/09/2013 Reason for consult: Initial assessment;NICU baby;Late preterm infant   Maternal Data Formula Feeding for Exclusion: Yes (baby in NICU) Infant to breast within first hour of birth: No Breastfeeding delayed due to:: Infant status Has patient been taught Hand Expression?: Yes Does the patient have breastfeeding experience prior to this delivery?: Yes  Feeding    LATCH Score/Interventions                      Lactation Tools Discussed/Used Tools: Pump WIC Program: Yes [employer] county - knows to call for DEP) Initiated by:: c Nurse, children's LC, at 11 hours post partum. We did not start her earlier, due to her cocain histroy, until a decision was made to pup and sump for 24 hours  Date initiated:: 07/09/13 (at 1730)   Consult Status Consult Status: Follow-up Date: 07/10/13 Follow-up type: In-patient    Alfred Levins 07/09/2013, 5:53 PM

## 2013-07-09 NOTE — Anesthesia Preprocedure Evaluation (Addendum)
Anesthesia Evaluation  Patient identified by MRN, date of birth, ID band Patient awake    Reviewed: Allergy & Precautions, H&P , NPO status , Patient's Chart, lab work & pertinent test results  Airway Mallampati: I TM Distance: >3 FB Neck ROM: full    Dental no notable dental hx.    Pulmonary Current Smoker,    Pulmonary exam normal       Cardiovascular negative cardio ROS   MVP   Neuro/Psych negative neurological ROS  negative psych ROS   GI/Hepatic negative GI ROS, Neg liver ROS,   Endo/Other  negative endocrine ROS  Renal/GU negative Renal ROS     Musculoskeletal negative musculoskeletal ROS (+)   Abdominal Normal abdominal exam  (+)   Peds  Hematology negative hematology ROS (+)   Anesthesia Other Findings   Reproductive/Obstetrics (+) Pregnancy                          Anesthesia Physical Anesthesia Plan  ASA: II and emergent  Anesthesia Plan: Spinal   Post-op Pain Management:    Induction:   Airway Management Planned:   Additional Equipment:   Intra-op Plan:   Post-operative Plan:   Informed Consent: I have reviewed the patients History and Physical, chart, labs and discussed the procedure including the risks, benefits and alternatives for the proposed anesthesia with the patient or authorized representative who has indicated his/her understanding and acceptance.     Plan Discussed with: CRNA and Surgeon  Anesthesia Plan Comments:         Anesthesia Quick Evaluation

## 2013-07-09 NOTE — Anesthesia Postprocedure Evaluation (Deleted)
Anesthesia Post Note  Patient: Leslie Christian  Procedure(s) Performed: Procedure(s) (LRB): CESAREAN SECTION (N/A)  Anesthesia type: General  Patient location: PACU  Post pain: Pain level controlled  Post assessment: Post-op Vital signs reviewed  Last Vitals:  Filed Vitals:   07/09/13 0730  BP: 122/66  Pulse: 49  Temp: 36.7 C  Resp: 20    Post vital signs: Reviewed  Level of consciousness: sedated  Complications: No apparent anesthesia complications

## 2013-07-09 NOTE — Op Note (Signed)
Preoperative Diagnosis:  IUP @ [redacted]w[redacted]d, PPROM, Breech presentation, Meconium Stained Fluid, insufficient prenatal care, maternal drug use  Postoperative Diagnosis:  Same  Procedure: Repeat low transverse cesarean section  Surgeon: Tinnie Gens, M.D.  Assistant: None  Findings: Viable female infant, APGAR (1 MIN): 8   APGAR (5 MINS): 9  Weight 4 lb 10 ounces, breech presentation  Estimated blood loss: 1000 cc  Complications: None known  Specimens: Placenta to pathology  Reason for procedure: Briefly, the patient is a 37 y.o. [id number] [redacted]w[redacted]d who presents for ruptured membranes.  Known breech presentation with meconium stained fluid.  She is poorly dated by 28 wk U/S in [place].  She has a previous C-section for abruption.  She does not desire sterilization.  Has h/o cocaine use during pregnancy and very limited prenatal care.  Procedure: Patient is a to the OR where spinal analgesia was administered. She was then placed in a supine position with left lateral tilt. She received 2 g of Ancef and SCDs were in place. A timeout was performed. She was prepped and draped in the usual sterile fashion. A Foley catheter was placed in the bladder. A knife was then used to make a Pfannenstiel incision. This incision was carried out to underlying fascia which was divided in the midline with the knife. The incision was extended laterally, sharply.  The rectus was divided in the midline.  The peritoneal cavity was entered bluntly.  Alexis retractor was placed inside the incision.  A knife was used to make a low transverse incision on the uterus. This incision was carried down to the amniotic cavity was entered. Fetus was in breech presentation and was brought up out of the incision without difficulty. Cord was clamped x 2 and cut. Infant taken to waiting pediatrician.  Cord blood was obtained. Placenta was delivered from the uterus.  Uterus was cleaned with dry lap pads. Uterine incision closed with 0 Vicryl suture in  a locked running fashion. A second layer of 0 Vicryl in an imbricating fashion was used to achieve hemostasis. Alexis retractor was removed from the abdomen. Peritoneal closure was done with 0 Vicryl suture.  Fascia is closed with 0 Vicryl suture in a running fashion. Subcutaneous tissue infused with 30cc 0.25% Marcaine.  Skin closed using 3-0 Vicryl on a Keith needle.  Steri strips applied, followed by pressure dressing.  All instrument, needle and lap counts were correct x 2.  Patient was awake and taken to PACU stable.  Infant to Newborn Nursery, stable.   Jrue Jarriel SMD 07/09/2013 6:34 AM

## 2013-07-09 NOTE — MAU Provider Note (Signed)
Chart reviewed and agree with management and plan.  

## 2013-07-09 NOTE — Anesthesia Postprocedure Evaluation (Signed)
Anesthesia Post Note  Patient: Leslie Christian  Procedure(s) Performed: Procedure(s) (LRB): CESAREAN SECTION (N/A)  Anesthesia type: Spinal  Patient location: PACU  Post pain: Pain level controlled  Post assessment: Post-op Vital signs reviewed  Last Vitals:  Filed Vitals:   07/09/13 0730  BP: 122/66  Pulse: 49  Temp: 36.7 C  Resp: 20    Post vital signs: Reviewed  Level of consciousness: awake  Complications: No apparent anesthesia complications

## 2013-07-09 NOTE — Transfer of Care (Signed)
Immediate Anesthesia Transfer of Care Note  Patient: Leslie Christian  Procedure(s) Performed: Procedure(s): CESAREAN SECTION (N/A)  Patient Location: PACU  Anesthesia Type:Spinal  Level of Consciousness: awake, alert  and oriented  Airway & Oxygen Therapy: Patient Spontanous Breathing  Post-op Assessment: Report given to PACU RN and Post -op Vital signs reviewed and stable  Post vital signs: Reviewed and stable  Complications: No apparent anesthesia complications

## 2013-07-09 NOTE — Progress Notes (Signed)
Ur chart review completed.  

## 2013-07-09 NOTE — H&P (Addendum)
HPI: Leslie Christian is a 37 y.o. year old [phone] female at [redacted] weeks gestation by 28 week ultrasound who presents to MAU reporting leaking green amniotic fluid since 2330. Pt states she went to a few hospital visits in [place] during the pregnancy, had one visit at [facility] and had one visit at [facility] 07/07/2013. She was sent to MAU from that visit due to Oligohydramnios and did not have a complete OB visit. Records not available from any prenatal care except for [facility]. She received BMZ x 2 07/07/2013 and afternoon of 07/08/2013.  Frank breech presentation per BS [place] in MAU.   Records received from [facility] in [place]. [place], [place]. EGA 34.6  w/ EDD 08/14/2013 by 28 week [place] at  [facility] Cocaine x 2.   Maternal Medical History:  Reason for admission: Rupture of membranes.     OB History   Grav Para Term Preterm Abortions TAB SAB Ect Mult Living   8 3 1 2 4 2 2   3      Past Medical History  Diagnosis Date  . Pregnant   . Asthma   . COPD (chronic obstructive pulmonary disease)   . Atrial fibrillation   . Mitral valve prolapse    reports "ruptured intestine" due to constipation   Past Surgical History  Procedure Laterality Date  . Cesarean section for breech and abruption    . Endoscopy     Family History: family history is not on file. Social History:  reports that she has been smoking Cigarettes.  She has a 12 pack-year smoking history. She has never used smokeless tobacco. She reports that she drank alcohol yesterday and used cocaine and Marijuana early in the pregnancy.   Prenatal Transfer Tool  Maternal Diabetes: No Genetic Screening: Declined Maternal Ultrasounds/Referrals: Abnormal:  Findings:   Other:Oligohydramnios Fetal Ultrasounds or other Referrals:  None Maternal Substance Abuse:  Yes:  Type: Cocaine, Other:  Significant Maternal Medications:  None Significant Maternal Lab Results:  Lab values include: Group B Strep positive, Rh  negative Other Comments:  ETOH 07/08/2013, limited prenatal care, + Chlamydia 05/2013, neg 06/2013. MJ  Review of Systems  Constitutional: Negative for fever and chills.  Eyes: Negative for blurred vision.  Gastrointestinal: Positive for abdominal pain (mild contractions). Negative for vomiting.  Genitourinary: Negative for dysuria, urgency, frequency, hematuria and flank pain.  Neurological: Negative for headaches.      Blood pressure 137/89, pulse 78, temperature 98.6 F (37 C), temperature source Oral, resp. rate 18, last menstrual period 10/26/2012. Maternal Exam:  Uterine Assessment: Contraction strength is mild.  Contraction frequency is irregular.   Abdomen: Surgical scars: low transverse.   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 Vulva is negative for lesion.  Normal vagina.  Ferning test: positive.  Amniotic fluid character: meconium stained.  Cervix: Cervix evaluated by sterile speculum exam.     Fetal Exam Fetal Monitor Review: Mode: ultrasound.   Baseline rate: 130.  Variability: moderate (6-25 bpm).   Pattern: accelerations present and no decelerations.    Fetal State Assessment: Category I - tracings are normal.     Physical Exam  Nursing note and vitals reviewed. Constitutional: She is oriented to person, place, and time. She appears well-developed and well-nourished. No distress.  HENT:  Head: Normocephalic.  Eyes: Conjunctivae are normal. No scleral icterus.  Neck: Normal range of motion.  Cardiovascular: Normal rate, regular rhythm and normal heart sounds.   Respiratory:  Effort normal. No respiratory distress. She has wheezes (mild). She has no rales.  GI: Soft. There is no tenderness.  Genitourinary: Vulva exhibits no lesion.  Leaking small amount of green fluid. Pos pooling on SSE. Cervix appears closed.   Musculoskeletal: Normal range of motion. She exhibits no edema.  Neurological: She is alert and oriented  to person, place, and time. She has normal reflexes.  Skin: Skin is warm and dry.  Psychiatric: She has a normal mood and affect.    Prenatal labs: ABO, Rh: --/--/A NEG (09/10 0310) Antibody: NEG (09/10 0310) Rubella:  Pending RPR:   pending HBsAg:   pending HIV:   nonreactive GBS:   positive  Results for orders placed during the hospital encounter of 07/09/13 (from the past 24 hour(s))  OB RESULTS CONSOLE HIV ANTIBODY (ROUTINE TESTING)     Status: None   Collection Time    07/09/13 12:00 AM      Result Value Range   HIV Non-reactive    OB RESULTS CONSOLE ABO/RH     Status: None   Collection Time    07/09/13 12:00 AM      Result Value Range   RH Type  Negative     ABO Grouping A    OB RESULTS CONSOLE GBS     Status: None   Collection Time    07/09/13 12:00 AM      Result Value Range   GBS Positive    URINALYSIS, ROUTINE W REFLEX MICROSCOPIC     Status: Abnormal   Collection Time    07/09/13  1:15 AM      Result Value Range   Color, Urine YELLOW  YELLOW   APPearance CLEAR  CLEAR   Specific Gravity, Urine <1.005 (*) 1.005 - 1.030   pH 5.5  5.0 - 8.0   Glucose, UA NEGATIVE  NEGATIVE mg/dL   Hgb urine dipstick TRACE (*) NEGATIVE   Bilirubin Urine NEGATIVE  NEGATIVE   Ketones, ur NEGATIVE  NEGATIVE mg/dL   Protein, ur NEGATIVE  NEGATIVE mg/dL   Urobilinogen, UA 0.2  0.0 - 1.0 mg/dL   Nitrite NEGATIVE  NEGATIVE   Leukocytes, UA TRACE (*) NEGATIVE  URINE RAPID DRUG SCREEN (HOSP PERFORMED)     Status: None   Collection Time    07/09/13  1:15 AM      Result Value Range   Opiates NONE DETECTED  NONE DETECTED   Cocaine NONE DETECTED  NONE DETECTED   Benzodiazepines NONE DETECTED  NONE DETECTED   Amphetamines NONE DETECTED  NONE DETECTED   Tetrahydrocannabinol NONE DETECTED  NONE DETECTED   Barbiturates NONE DETECTED  NONE DETECTED  URINE MICROSCOPIC-ADD ON     Status: Abnormal   Collection Time    07/09/13  1:15 AM      Result Value Range   Squamous Epithelial / LPF  FEW (*) RARE   WBC, UA 3-6  <3 WBC/hpf   RBC / HPF 3-6  <3 RBC/hpf   Bacteria, UA FEW (*) RARE  GROUP B STREP BY PCR     Status: Abnormal   Collection Time    07/09/13  3:00 AM      Result Value Range   Group B strep by PCR POSITIVE (*) NEGATIVE  POCT FERN TEST     Status: None   Collection Time    07/09/13  3:06 AM      Result Value Range   POCT Fern Test Positive = ruptured amniotic membanes  CBC     Status: Abnormal   Collection Time    07/09/13  3:10 AM      Result Value Range   WBC 16.7 (*) 4.0 - 10.5 K/uL   RBC 3.78 (*) 3.87 - 5.11 MIL/uL   Hemoglobin 11.1 (*) 12.0 - 15.0 g/dL   HCT 11.9 (*) 14.7 - 82.9 %   MCV 86.8  78.0 - 100.0 fL   MCH 29.4  26.0 - 34.0 pg   MCHC 33.8  30.0 - 36.0 g/dL   RDW 56.2 (*) 13.0 - 86.5 %   Platelets 170  150 - 400 K/uL  DIFFERENTIAL     Status: Abnormal   Collection Time    07/09/13  3:10 AM      Result Value Range   Neutrophils Relative % 79 (*) 43 - 77 %   Neutro Abs 13.2 (*) 1.7 - 7.7 K/uL   Lymphocytes Relative 17  12 - 46 %   Lymphs Abs 2.8  0.7 - 4.0 K/uL   Monocytes Relative 4  3 - 12 %   Monocytes Absolute 0.7  0.1 - 1.0 K/uL   Eosinophils Relative 0  0 - 5 %   Eosinophils Absolute 0.0  0.0 - 0.7 K/uL   Basophils Relative 0  0 - 1 %   Basophils Absolute 0.0  0.0 - 0.1 K/uL  TYPE AND SCREEN     Status: None   Collection Time    07/09/13  3:10 AM      Result Value Range   ABO/RH(D) A NEG     Antibody Screen NEG     Sample Expiration 07/12/2013    RAPID HIV SCREEN ([facility])     Status: None   Collection Time    07/09/13  3:10 AM      Result Value Range   SUDS Rapid HIV Screen NON REACTIVE  NON REACTIVE  ABO/RH     Status: None   Collection Time    07/09/13  3:10 AM      Result Value Range   ABO/RH(D) A NEG      Assessment: 1. Labor: PPROM with meconium-stained fluid. 2. Fetal Wellbeing: Category I  3. Pain Control: None 4. GBS: Positive 5. 34.6 week IUP 6. leukocytosis without evidence of chorioamnionitis. 7.  Limited prenatal care. 8. History of substance abuse. 9. Frank breech presentation.   Plan:  1. Prep for OR per consult with Dr. Shawnie Pons. 2. OB panel and ethanol level pending.  Oceana Walthall 07/09/2013, 4:04 AM
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
## 2013-07-10 ENCOUNTER — Encounter (HOSPITAL_COMMUNITY): Payer: Self-pay | Admitting: Family Medicine

## 2013-07-10 ENCOUNTER — Encounter: Payer: Self-pay | Admitting: Advanced Practice Midwife

## 2013-07-10 LAB — URINE CULTURE
Colony Count: NO GROWTH
Culture: NO GROWTH

## 2013-07-10 LAB — CBC
Hemoglobin: 11.2 g/dL — ABNORMAL LOW (ref 12.0–15.0)
MCHC: 33.5 g/dL (ref 30.0–36.0)
Platelets: 173 10*3/uL (ref 150–400)
RDW: 17.1 % — ABNORMAL HIGH (ref 11.5–15.5)

## 2013-07-10 NOTE — Progress Notes (Signed)
Subjective: Postpartum Day 1: Cesarean Delivery Patient reports incisional pain, tolerating PO, + flatus and no problems voiding.    Objective: Vital signs in last 24 hours: Temp:  [97.4 F (36.3 C)-98.1 F (36.7 C)] 98.1 F (36.7 C) (09/11 0524) Pulse Rate:  [50-76] 52 (09/11 0524) Resp:  [18-20] 18 (09/11 0524) BP: (106-139)/(55-89) 117/81 mmHg (09/11 0524) SpO2:  [94 %-97 %] 97 % (09/11 0524) Weight:  [59.875 kg (132 lb)] 59.875 kg (132 lb) (09/10 0920)  Physical Exam:  General: alert, cooperative and somewhat nervous Lochia: appropriate Uterine Fundus: firm Incision: healing well, no significant drainage, dressing changed as was saturated more with serousanguinous fluid likely from marcaine post delivery.  DVT Evaluation: No evidence of DVT seen on physical exam.   Recent Labs  07/09/13 0310 07/10/13 0515  HGB 11.1* 11.2*  HCT 32.8* 33.4*    Assessment/Plan: Status post Cesarean section. Doing well postoperatively.  Continue current care SW to see today given hx of substance abuse No signs of withdrawal currently Cont to monitor.  Aldean Pipe L 07/10/2013, 8:15 AM

## 2013-07-11 LAB — RH IG WORKUP (INCLUDES ABO/RH)
ABO/RH(D): A NEG
Gestational Age(Wks): 34.6

## 2013-07-11 MED ORDER — HYDROCORTISONE 1 % EX CREA
TOPICAL_CREAM | Freq: Three times a day (TID) | CUTANEOUS | Status: DC
  Administered 2013-07-11 – 2013-07-12 (×4): via TOPICAL
  Filled 2013-07-11: qty 28

## 2013-07-11 NOTE — Progress Notes (Signed)
Leslie Christian reported that they are doing well and that her son may come home soon.   At family's request, I offered prayer, as well as encouragement and celebration.  [employer] Pager, [phone] 2:45 PM   07/11/13 1400  Clinical Encounter Type  Visited With Patient and family together  Visit Type Spiritual support  Referral From Nurse  Spiritual Encounters  Spiritual Needs Prayer
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
## 2013-07-11 NOTE — Progress Notes (Signed)
Subjective: Postpartum Day 2: Cesarean Delivery Patient reports tolerating PO, + flatus and no problems voiding.    Objective: Vital signs in last 24 hours: Temp:  [97.7 F (36.5 C)-98.1 F (36.7 C)] 97.8 F (36.6 C) (09/12 1610) Pulse Rate:  [51-81] 81 (09/12 0633) Resp:  [18] 18 (09/12 0633) BP: (114-135)/(59-80) 135/80 mmHg (09/12 0633) SpO2:  [96 %-100 %] 97 % (09/12 9604)  Physical Exam:  General: alert, cooperative and no distress Lochia: appropriate Uterine Fundus: firm Incision: No significant drainage. Dressing is minimally saturated DVT Evaluation: No evidence of DVT seen on physical exam.   Recent Labs  07/09/13 0310 07/10/13 0515  HGB 11.1* 11.2*  HCT 32.8* 33.4*    Assessment/Plan: Status post Cesarean section. Doing well postoperatively.  Continue current care.  Jacquelin Hawking, MD 07/11/2013, 7:32 AM  Evaluation and management procedures were performed by Resident physician under my supervision/collaboration. Chart reviewed, patient examined by me and I agree with management and plan. Danae Orleans, CNM 07/11/2013 9:25 AM

## 2013-07-11 NOTE — Progress Notes (Signed)
Patient ID: Leslie Christian, female   DOB: 08-Oct-1976, 37 y.o.   MRN: [id number]  Rash SUBJECTIVE: Pruritic lesions right arm and shoulder she thinks are due to bed bugs from being in motel. Had Benadryl with some relief.  OBJECTIVE: Scatter 1-2 mm scabbed lesions rt upper extremity and post shoulder area, not inflamed or draining.  ASSESSMENT: Skin lesions likely insect bites  PLAN: Hydrocortisone cream Danae Orleans, CNM 07/11/2013 11:06 AM

## 2013-07-11 NOTE — Progress Notes (Signed)
CSW attempted to make referral for outpatient counseling, however, was informed by Mental Health intake line ([facility], since MOB has [place] Medicaid) that MOB would have to call herself to initiate services.  CSW provided MOB with a list/contact information of three Mental Health providers in her area: [facility], [facility] and [facility].  CSW asked if MOB would make her own appointment and she agreed and seems highly motivated to follow through.  MOB is aware of CPS report, tentative discharge plan, and possibility that baby may remain in hospital if meconium result comes back positive for Cocaine prior to his discharge.  MOB is pleasant, agreeable and understanding.   

## 2013-07-12 MED ORDER — HYDROCORTISONE 1 % EX CREA: TOPICAL_CREAM | Freq: Three times a day (TID) | CUTANEOUS | Status: DC

## 2013-07-12 MED ORDER — IBUPROFEN 600 MG PO TABS: 600.0000 mg | ORAL_TABLET | Freq: Four times a day (QID) | ORAL | Status: DC

## 2013-07-12 MED ORDER — OXYCODONE-ACETAMINOPHEN 5-325 MG PO TABS: 1.0000 | ORAL_TABLET | ORAL | Status: DC | PRN

## 2013-07-12 NOTE — Discharge Summary (Signed)
Obstetric Discharge Summary Reason for Admission: rupture of membranes @ 34.6wks with breech presentation Prenatal Procedures: none Intrapartum Procedures: cesarean: low cervical, transverse Postpartum Procedures: none Complications-Operative and Postpartum: none Hemoglobin  Date Value Range Status  07/10/2013 11.2* 12.0 - 15.0 g/dL Final     HCT  Date Value Range Status  07/10/2013 33.4* 36.0 - 46.0 % Final   [name] is a 37 y.o. year old [id number] female presenting at 34.[redacted] weeks gestation by 28 week ultrasound reporting leaking green amniotic fluid since 2330 on 07/08/13. Pt states she went to a few hospital visits in [place] during the pregnancy, had one visit at [facility] and had one visit at [facility] on 07/07/2013. She was sent to MAU from that visit due to oligohydramnios and did not have a complete OB visit, but did receive BMZ x 1 during that visit as well as a second dose in the afternoon of 9/9. Records received from [facility] in [place]. [name], [place] confirming best EDD and showing pos cocaine x 2.  Pt was consented and taken to the OR. During her PP course her vitals remained stable as well as her labwork. She had multiple conversations with social work who involved CPS due to +cocaine during the preg. UDS during this hospitalization was neg. The pt has received the full benefit of her hospital stay and she will be discharged home today, POD#3.    Physical Exam:  General: alert, cooperative and mild distress Heart: RRR Lungs: nl effort Lochia: appropriate Uterine Fundus: firm Incision: healing well, med stained area on honeycomb dsg- serous dng DVT Evaluation: No evidence of DVT seen on physical exam.  Discharge Diagnoses: Preterm del at 34.6wks; RLTC/S for breech and SROM  Discharge Information: Date: 07/12/2013 Activity: pelvic rest Diet: routine Medications: PNV, Ibuprofen and Percocet Condition: stable Instructions: refer to practice specific  booklet Discharge to: home Follow-up Information   Follow up with [facility]. (Make a postpartum appointment for 4-6 weeks)    Contact information:   [address] [place] [address] [phone]      Newborn Data: Live born female  Birth Weight: 4 lb 10.4 oz (2109 g) APGAR: 8, 9  Home with mother, pending CPS approval based on infant meconium testing.  Cam Hai 07/12/2013, 7:27 AM

## 2013-07-16 ENCOUNTER — Telehealth: Payer: Self-pay | Admitting: Obstetrics & Gynecology

## 2013-07-16 NOTE — Telephone Encounter (Signed)
Pt had c-section 07/09/2013 states "incision site red, open and pouring drainage" Pt informed to go to [facility] to be evaluated. Pt verbalized understanding.

## 2013-07-17 ENCOUNTER — Ambulatory Visit (INDEPENDENT_AMBULATORY_CARE_PROVIDER_SITE_OTHER): Payer: Medicaid Other | Admitting: Adult Health

## 2013-07-17 ENCOUNTER — Ambulatory Visit: Payer: Medicaid Other | Admitting: Advanced Practice Midwife

## 2013-07-17 ENCOUNTER — Encounter: Payer: Self-pay | Admitting: Adult Health

## 2013-07-17 VITALS — BP 110/72 | Ht 64.0 in | Wt 122.0 lb

## 2013-07-17 DIAGNOSIS — T792XXA Traumatic secondary and recurrent hemorrhage and seroma, initial encounter: Secondary | ICD-10-CM

## 2013-07-17 DIAGNOSIS — O909 Complication of the puerperium, unspecified: Secondary | ICD-10-CM

## 2013-07-17 NOTE — Patient Instructions (Addendum)
keep incision clean and dry Follow up as scheduled

## 2013-07-17 NOTE — Progress Notes (Signed)
Subjective:     Patient ID: Leslie Christian, female   DOB: 08-04-1976, 37 y.o.   MRN: [id number]  HPI Leslie Christian is in complaining of drainage from C-section incision and small opening.She is breast feeding.Her c-section was 07/09/13.   Review of Systems See HPI Reviewed past medical,surgical, social and family history. Reviewed medications and allergies.     Objective:   Physical Exam BP 110/72  Ht 5\' 4"  (1.626 m)  Wt 122 lb (55.339 kg)  BMI 20.93 kg/m2  LMP 10/26/2012  Breastfeeding? Yes   Skin warm and dry, incision has 2 small areas of separation and serous fluid drainage when pressed No redness or heat noted, Dr Despina Hidden in to co exam., Assessment:      Incisional serous drainage ?seroma     Plan:    Keep clean and dry Ok to wear pantie girdle Follow up as scheduled

## 2013-07-19 ENCOUNTER — Emergency Department (HOSPITAL_COMMUNITY): Payer: Medicaid Other

## 2013-07-19 ENCOUNTER — Ambulatory Visit (HOSPITAL_COMMUNITY)
Admission: EM | Admit: 2013-07-19 | Discharge: 2013-07-21 | Disposition: A | Discharge status: Home / Self Care | Payer: Medicaid Other | Attending: Obstetrics & Gynecology | Admitting: Obstetrics & Gynecology

## 2013-07-19 ENCOUNTER — Encounter (HOSPITAL_COMMUNITY): Payer: Self-pay | Admitting: *Deleted

## 2013-07-19 DIAGNOSIS — O42913 Preterm premature rupture of membranes, unspecified as to length of time between rupture and onset of labor, third trimester: Secondary | ICD-10-CM

## 2013-07-19 DIAGNOSIS — S8010XA Contusion of unspecified lower leg, initial encounter: Secondary | ICD-10-CM | POA: Insufficient documentation

## 2013-07-19 DIAGNOSIS — L089 Local infection of the skin and subcutaneous tissue, unspecified: Secondary | ICD-10-CM

## 2013-07-19 DIAGNOSIS — O900 Disruption of cesarean delivery wound: Secondary | ICD-10-CM

## 2013-07-19 DIAGNOSIS — T07XXXA Unspecified multiple injuries, initial encounter: Secondary | ICD-10-CM

## 2013-07-19 DIAGNOSIS — R109 Unspecified abdominal pain: Secondary | ICD-10-CM | POA: Insufficient documentation

## 2013-07-19 DIAGNOSIS — A749 Chlamydial infection, unspecified: Secondary | ICD-10-CM

## 2013-07-19 DIAGNOSIS — Y92410 Unspecified street and highway as the place of occurrence of the external cause: Secondary | ICD-10-CM | POA: Insufficient documentation

## 2013-07-19 DIAGNOSIS — S20219A Contusion of unspecified front wall of thorax, initial encounter: Secondary | ICD-10-CM | POA: Insufficient documentation

## 2013-07-19 DIAGNOSIS — IMO0002 Reserved for concepts with insufficient information to code with codable children: Secondary | ICD-10-CM | POA: Insufficient documentation

## 2013-07-19 HISTORY — DX: Cervicalgia: M54.2

## 2013-07-19 HISTORY — DX: Anxiety disorder, unspecified: F41.9

## 2013-07-19 HISTORY — DX: Other psychoactive substance abuse, uncomplicated: F19.10

## 2013-07-19 HISTORY — DX: Other chronic pain: G89.29

## 2013-07-19 HISTORY — DX: Migraine, unspecified, not intractable, without status migrainosus: G43.909

## 2013-07-19 HISTORY — DX: Dorsalgia, unspecified: M54.9

## 2013-07-19 MED ORDER — FENTANYL CITRATE 0.05 MG/ML IJ SOLN
50.0000 ug | INTRAMUSCULAR | Status: AC | PRN
  Administered 2013-07-19 – 2013-07-20 (×2): 50 ug via INTRAVENOUS
  Filled 2013-07-19 (×2): qty 2

## 2013-07-19 MED ORDER — ONDANSETRON HCL 4 MG/2ML IJ SOLN
4.0000 mg | INTRAMUSCULAR | Status: AC | PRN
  Administered 2013-07-19 – 2013-07-20 (×2): 4 mg via INTRAVENOUS
  Filled 2013-07-19: qty 2

## 2013-07-19 MED ORDER — SODIUM CHLORIDE 0.9 % IV SOLN: INTRAVENOUS | Status: DC

## 2013-07-19 MED ORDER — IOHEXOL 300 MG/ML  SOLN: 100.0000 mL | Freq: Once | INTRAMUSCULAR | Status: AC | PRN
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
## 2013-07-19 NOTE — ED Notes (Signed)
c collar placed on pt. Pt complaining of increased abdominal pain
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
## 2013-07-20 ENCOUNTER — Inpatient Hospital Stay (HOSPITAL_COMMUNITY): Payer: Medicaid Other | Admitting: Anesthesiology

## 2013-07-20 ENCOUNTER — Encounter (HOSPITAL_COMMUNITY): Payer: Self-pay | Admitting: Anesthesiology

## 2013-07-20 ENCOUNTER — Encounter (HOSPITAL_COMMUNITY): Payer: Self-pay

## 2013-07-20 ENCOUNTER — Inpatient Hospital Stay (HOSPITAL_COMMUNITY): Payer: Medicaid Other

## 2013-07-20 ENCOUNTER — Encounter (HOSPITAL_COMMUNITY)
Admission: EM | Disposition: A | Discharge status: Home / Self Care | Payer: Self-pay | Source: Home / Self Care | Attending: Emergency Medicine

## 2013-07-20 DIAGNOSIS — IMO0002 Reserved for concepts with insufficient information to code with codable children: Secondary | ICD-10-CM

## 2013-07-20 DIAGNOSIS — O900 Disruption of cesarean delivery wound: Secondary | ICD-10-CM

## 2013-07-20 DIAGNOSIS — S8010XA Contusion of unspecified lower leg, initial encounter: Secondary | ICD-10-CM

## 2013-07-20 DIAGNOSIS — S20219A Contusion of unspecified front wall of thorax, initial encounter: Secondary | ICD-10-CM

## 2013-07-20 HISTORY — PX: EXAMINATION UNDER ANESTHESIA: SHX1540

## 2013-07-20 LAB — CBC
Hemoglobin: 12.8 g/dL (ref 12.0–15.0)
Platelets: 378 10*3/uL (ref 150–400)
RBC: 4.4 MIL/uL (ref 3.87–5.11)
WBC: 13.9 10*3/uL — ABNORMAL HIGH (ref 4.0–10.5)

## 2013-07-20 LAB — POCT I-STAT, CHEM 8
BUN: 22 mg/dL (ref 6–23)
Chloride: 108 mEq/L (ref 96–112)
Creatinine, Ser: 0.8 mg/dL (ref 0.50–1.10)
Potassium: 3 mEq/L — ABNORMAL LOW (ref 3.5–5.1)
Sodium: 140 mEq/L (ref 135–145)

## 2013-07-20 LAB — URINALYSIS, ROUTINE W REFLEX MICROSCOPIC
Ketones, ur: NEGATIVE mg/dL
Leukocytes, UA: NEGATIVE
Nitrite: NEGATIVE
Specific Gravity, Urine: 1.025 (ref 1.005–1.030)
Urobilinogen, UA: 0.2 mg/dL (ref 0.0–1.0)
pH: 5.5 (ref 5.0–8.0)

## 2013-07-20 LAB — RAPID URINE DRUG SCREEN, HOSP PERFORMED: Barbiturates: NOT DETECTED

## 2013-07-20 SURGERY — EXAM UNDER ANESTHESIA
Anesthesia: General | Site: Abdomen | Wound class: Clean Contaminated

## 2013-07-20 MED ORDER — ONDANSETRON HCL 4 MG PO TABS: 4.0000 mg | ORAL_TABLET | Freq: Four times a day (QID) | ORAL | Status: DC | PRN

## 2013-07-20 MED ORDER — ZOLPIDEM TARTRATE 5 MG PO TABS
5.0000 mg | ORAL_TABLET | Freq: Every evening | ORAL | Status: DC | PRN
  Administered 2013-07-21: 5 mg via ORAL
  Filled 2013-07-20: qty 1

## 2013-07-20 MED ORDER — FAMOTIDINE IN NACL 20-0.9 MG/50ML-% IV SOLN
INTRAVENOUS | Status: AC
  Administered 2013-07-20: 20 mg
  Filled 2013-07-20: qty 50

## 2013-07-20 MED ORDER — MORPHINE SULFATE 4 MG/ML IJ SOLN
4.0000 mg | INTRAMUSCULAR | Status: AC | PRN
  Administered 2013-07-20 (×2): 4 mg via INTRAVENOUS
  Filled 2013-07-20 (×3): qty 1

## 2013-07-20 MED ORDER — PROMETHAZINE HCL 25 MG/ML IJ SOLN: 6.2500 mg | INTRAMUSCULAR | Status: DC | PRN

## 2013-07-20 MED ORDER — IBUPROFEN 800 MG PO TABS
800.0000 mg | ORAL_TABLET | Freq: Three times a day (TID) | ORAL | Status: DC | PRN
  Administered 2013-07-21: 800 mg via ORAL
  Filled 2013-07-20: qty 1

## 2013-07-20 MED ORDER — DEXAMETHASONE SODIUM PHOSPHATE 10 MG/ML IJ SOLN
INTRAMUSCULAR | Status: DC | PRN
  Administered 2013-07-20: 10 mg via INTRAVENOUS

## 2013-07-20 MED ORDER — FENTANYL CITRATE 0.05 MG/ML IJ SOLN
INTRAMUSCULAR | Status: DC | PRN
  Administered 2013-07-20 (×2): 50 ug via INTRAVENOUS
  Administered 2013-07-20: 200 ug via INTRAVENOUS
  Administered 2013-07-20 (×3): 50 ug via INTRAVENOUS

## 2013-07-20 MED ORDER — HYDROMORPHONE HCL PF 1 MG/ML IJ SOLN: 0.2500 mg | INTRAMUSCULAR | Status: DC | PRN

## 2013-07-20 MED ORDER — IOHEXOL 300 MG/ML  SOLN
100.0000 mL | Freq: Once | INTRAMUSCULAR | Status: AC | PRN
  Administered 2013-07-20: 100 mL via INTRAVENOUS

## 2013-07-20 MED ORDER — GLYCOPYRROLATE 0.2 MG/ML IJ SOLN
INTRAMUSCULAR | Status: DC | PRN
  Administered 2013-07-20: 0.2 mg via INTRAVENOUS

## 2013-07-20 MED ORDER — ALUM & MAG HYDROXIDE-SIMETH 200-200-20 MG/5ML PO SUSP
30.0000 mL | ORAL | Status: DC | PRN
  Filled 2013-07-20: qty 30

## 2013-07-20 MED ORDER — OXYCODONE-ACETAMINOPHEN 5-325 MG PO TABS
1.0000 | ORAL_TABLET | ORAL | Status: DC | PRN
  Administered 2013-07-20 – 2013-07-21 (×4): 2 via ORAL
  Filled 2013-07-20 (×4): qty 2

## 2013-07-20 MED ORDER — 0.9 % SODIUM CHLORIDE (POUR BTL) OPTIME
TOPICAL | Status: DC | PRN
  Administered 2013-07-20: 1000 mL

## 2013-07-20 MED ORDER — HYDROMORPHONE HCL PF 1 MG/ML IJ SOLN
1.0000 mg | Freq: Once | INTRAMUSCULAR | Status: AC
  Administered 2013-07-20: 1 mg via INTRAVENOUS
  Filled 2013-07-20: qty 1

## 2013-07-20 MED ORDER — INFLUENZA VAC SPLIT QUAD 0.5 ML IM SUSP
0.5000 mL | INTRAMUSCULAR | Status: AC
  Administered 2013-07-21: 0.5 mL via INTRAMUSCULAR

## 2013-07-20 MED ORDER — ONDANSETRON HCL 4 MG/2ML IJ SOLN: 4.0000 mg | Freq: Four times a day (QID) | INTRAMUSCULAR | Status: DC | PRN

## 2013-07-20 MED ORDER — KETOROLAC TROMETHAMINE 30 MG/ML IJ SOLN
15.0000 mg | Freq: Once | INTRAMUSCULAR | Status: AC | PRN
  Administered 2013-07-20: 30 mg via INTRAVENOUS

## 2013-07-20 MED ORDER — CEFAZOLIN SODIUM-DEXTROSE 2-3 GM-% IV SOLR
INTRAVENOUS | Status: AC
  Administered 2013-07-20: 2 g via INTRAVENOUS
  Filled 2013-07-20: qty 50

## 2013-07-20 MED ORDER — PROPOFOL 10 MG/ML IV BOLUS
INTRAVENOUS | Status: DC | PRN
  Administered 2013-07-20: 180 mg via INTRAVENOUS

## 2013-07-20 MED ORDER — ONDANSETRON HCL 4 MG/2ML IJ SOLN
INTRAMUSCULAR | Status: DC | PRN
  Administered 2013-07-20: 4 mg via INTRAVENOUS

## 2013-07-20 MED ORDER — LACTATED RINGERS IV SOLN
INTRAVENOUS | Status: DC
  Administered 2013-07-20: 09:00:00 via INTRAVENOUS

## 2013-07-20 MED ORDER — MIDAZOLAM HCL 2 MG/2ML IJ SOLN: 0.5000 mg | Freq: Once | INTRAMUSCULAR | Status: DC | PRN

## 2013-07-20 MED ORDER — HYDROMORPHONE HCL PF 1 MG/ML IJ SOLN
0.5000 mg | Freq: Once | INTRAMUSCULAR | Status: AC
  Administered 2013-07-20: 0.5 mg via INTRAVENOUS
  Filled 2013-07-20: qty 1

## 2013-07-20 MED ORDER — FAMOTIDINE IN NACL 20-0.9 MG/50ML-% IV SOLN: 20.0000 mg | Freq: Once | INTRAVENOUS | Status: DC

## 2013-07-20 MED ORDER — LACTATED RINGERS IV SOLN
INTRAVENOUS | Status: DC
  Administered 2013-07-20 (×2): via INTRAVENOUS

## 2013-07-20 MED ORDER — MEPERIDINE HCL 25 MG/ML IJ SOLN: 6.2500 mg | INTRAMUSCULAR | Status: DC | PRN

## 2013-07-20 MED ORDER — HYDROMORPHONE HCL 2 MG PO TABS
2.0000 mg | ORAL_TABLET | ORAL | Status: DC | PRN
  Administered 2013-07-20 – 2013-07-21 (×4): 2 mg via ORAL
  Filled 2013-07-20 (×4): qty 1

## 2013-07-20 MED ORDER — LACTATED RINGERS IV SOLN
INTRAVENOUS | Status: DC | PRN
  Administered 2013-07-20 (×2): via INTRAVENOUS

## 2013-07-20 MED ORDER — PROMETHAZINE HCL 25 MG/ML IJ SOLN
12.5000 mg | Freq: Once | INTRAMUSCULAR | Status: AC
  Administered 2013-07-20: 12.5 mg via INTRAVENOUS
  Filled 2013-07-20: qty 1

## 2013-07-20 MED ORDER — EPHEDRINE SULFATE 50 MG/ML IJ SOLN
INTRAMUSCULAR | Status: DC | PRN
  Administered 2013-07-20: 10 mg via INTRAVENOUS
  Administered 2013-07-20: 30 mg via INTRAVENOUS

## 2013-07-20 MED ORDER — MIDAZOLAM HCL 2 MG/2ML IJ SOLN
INTRAMUSCULAR | Status: DC | PRN
  Administered 2013-07-20: 2 mg via INTRAVENOUS

## 2013-07-20 SURGICAL SUPPLY — 18 items
APL SKNCLS STERI-STRIP NONHPOA (GAUZE/BANDAGES/DRESSINGS) ×1
BENZOIN TINCTURE PRP APPL 2/3 (GAUZE/BANDAGES/DRESSINGS) ×1 IMPLANT
CLOTH BEACON ORANGE TIMEOUT ST (SAFETY) ×2 IMPLANT
DRSG OPSITE 11X17.75 LRG (GAUZE/BANDAGES/DRESSINGS) ×1 IMPLANT
GLOVE BIOGEL PI IND STRL 6.5 (GLOVE) IMPLANT
GLOVE BIOGEL PI IND STRL 7.0 (GLOVE) IMPLANT
GLOVE BIOGEL PI INDICATOR 6.5 (GLOVE) ×1
GLOVE BIOGEL PI INDICATOR 7.0 (GLOVE) ×2
GLOVE SKINSENSE NS SZ6.5 (GLOVE) ×3
GLOVE SKINSENSE STRL SZ6.5 (GLOVE) IMPLANT
GOWN STRL REIN XL XLG (GOWN DISPOSABLE) ×4 IMPLANT
PACK ABDOMINAL GYN (CUSTOM PROCEDURE TRAY) ×1 IMPLANT
STRIP CLOSURE SKIN 1/2X4 (GAUZE/BANDAGES/DRESSINGS) ×1 IMPLANT
SUT PDS AB 0 CTX 60 (SUTURE) ×1 IMPLANT
SUT VIC AB 0 CTB1 27 (SUTURE) ×1 IMPLANT
SUT VIC AB 3-0 CT1 27 (SUTURE) ×2
SUT VIC AB 3-0 CT1 TAPERPNT 27 (SUTURE) IMPLANT
TOWEL OR 17X24 6PK STRL BLUE (TOWEL DISPOSABLE) ×1 IMPLANT
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
## 2013-07-20 NOTE — Transfer of Care (Signed)
Immediate Anesthesia Transfer of Care Note  Patient: Leslie Christian  Procedure(s) Performed: Procedure(s): EXAM UNDER ANESTHESIA / Repair of traumatic dehisence of cesarean incision.. (N/A)  Patient Location: PACU  Anesthesia Type:General  Level of Consciousness: awake, alert  and oriented  Airway & Oxygen Therapy: Patient Spontanous Breathing and Patient connected to nasal cannula oxygen  Post-op Assessment: Report given to PACU RN and Post -op Vital signs reviewed and stable  Post vital signs: Reviewed and stable  Complications: No apparent anesthesia complications
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
## 2013-07-20 NOTE — ED Notes (Signed)
Pt sat straight up in bed and ripped her collar off. Pt c/o pain.
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
## 2013-07-20 NOTE — Anesthesia Preprocedure Evaluation (Signed)
Anesthesia Evaluation  Patient identified by MRN, date of birth, ID band Patient awake    Reviewed: Allergy & Precautions, H&P , NPO status , Patient's Chart, lab work & pertinent test results  Airway Mallampati: II TM Distance: >3 FB Neck ROM: full  Mouth opening: Limited Mouth Opening  Dental  (+) Teeth Intact and Poor Dentition   Pulmonary Current Smoker,    Pulmonary exam normal       Cardiovascular negative cardio ROS      Neuro/Psych    GI/Hepatic negative GI ROS, Neg liver ROS,   Endo/Other  negative endocrine ROS  Renal/GU negative Renal ROS  negative genitourinary   Musculoskeletal negative musculoskeletal ROS (+)   Abdominal Normal abdominal exam  (+)   Peds negative pediatric ROS (+)  Hematology negative hematology ROS (+)   Anesthesia Other Findings   Reproductive/Obstetrics negative OB ROS                           Anesthesia Physical Anesthesia Plan  ASA: II and emergent  Anesthesia Plan: General   Post-op Pain Management:    Induction: Intravenous  Airway Management Planned: LMA  Additional Equipment:   Intra-op Plan:   Post-operative Plan:   Informed Consent: I have reviewed the patients History and Physical, chart, labs and discussed the procedure including the risks, benefits and alternatives for the proposed anesthesia with the patient or authorized representative who has indicated his/her understanding and acceptance.     Plan Discussed with: CRNA and Surgeon  Anesthesia Plan Comments:         Anesthesia Quick Evaluation

## 2013-07-20 NOTE — Anesthesia Procedure Notes (Signed)
Procedure Name: LMA Insertion Date/Time: 07/20/2013 9:55 AM Performed by: Lincoln Brigham Pre-anesthesia Checklist: Patient identified, Emergency Drugs available, Suction available and Patient being monitored Patient Re-evaluated:Patient Re-evaluated prior to inductionOxygen Delivery Method: Circle system utilized Preoxygenation: Pre-oxygenation with 100% oxygen Intubation Type: IV induction Ventilation: Mask ventilation without difficulty LMA: LMA inserted Number of attempts: 1 Dental Injury: Teeth and Oropharynx as per pre-operative assessment

## 2013-07-20 NOTE — MAU Note (Signed)
Pt transferred from [facility] for evaluation of c/section incision. Incision as two places with tissue protruding. One piece is one inch by one inch on the left side of the incision and the second piece is smaller on the right side. Peripad in place over incision.

## 2013-07-20 NOTE — Consult Note (Signed)
Lactation Consultation Note  Patient Name: Leslie Christian [id number] Date: 07/20/2013  [facility] called by [facility] staff regarding whether mom needed to pump and dump due to CT- contrast, confirmed with [facility] RN , ok to pump and no need to dump per Bobbye Morton MD, Mediations and mothers milk resource.  LC visited mom in room , mom lying in bed, had recently pumped 3 oz of milk. Per mom having difficulty with pain control. Per grandma baby is taking up 3 oz . Per mom I've been feeding from the breast at home, bottle and pumping. Baby has a weight check due Tuesday with [facility] department visiting nurse. Presently baby is at pt's Aunts home being cared for .    Maternal Data    Feeding    [facility] Score/Interventions                      Lactation Tools Discussed/Used     Consult Status      Kathrin Greathouse 07/20/2013, 5:12 PM

## 2013-07-20 NOTE — MAU Note (Signed)
Bedpan offered; patient unable to urinate at this time.

## 2013-07-20 NOTE — Anesthesia Postprocedure Evaluation (Signed)
Anesthesia Post Note  Patient: Leslie Christian  Procedure(s) Performed: Procedure(s) (LRB): EXAM UNDER ANESTHESIA / Repair of traumatic dehisence of cesarean incision.. (N/A)  Anesthesia type: General  Patient location: PACU  Post pain: Pain level controlled  Post assessment: Post-op Vital signs reviewed  Last Vitals:  Filed Vitals:   07/20/13 0921  BP: 103/79  Pulse: 51  Temp: 36.9 C  Resp: 18    Post vital signs: Reviewed  Level of consciousness: sedated  Complications: No apparent anesthesia complications

## 2013-07-21 ENCOUNTER — Encounter (HOSPITAL_COMMUNITY): Payer: Self-pay | Admitting: Obstetrics & Gynecology

## 2013-07-21 DIAGNOSIS — O900 Disruption of cesarean delivery wound: Secondary | ICD-10-CM

## 2013-07-21 MED ORDER — OXYCODONE-ACETAMINOPHEN 5-325 MG PO TABS: 1.0000 | ORAL_TABLET | ORAL | Status: DC | PRN
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
## 2013-07-21 NOTE — Progress Notes (Signed)
CSW met with the very defensive pt this morning to offer substance abuse treatment resources.   Pt was accompanied by her mother, who also did not seem happy about CSW intervention.  Pt admitted to a past history of substance abuse but told CSW that she has no desire to use drugs anymore.  CSW inquired how her infant's meconium tested positive for cocaine & she told CSW that she smoked a "blunt laced with cocaine," on her birthday.  [facility] CPS is involved however she states she still has custody of her child.   Pt is not interested in any resources this CSW has to offer & seems annoyed by this writers presence.  CSW signing off.
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
## 2013-07-21 NOTE — Discharge Summary (Signed)
Nursing  1300:  Pt d/c'ed at 1230.  Pt left c/o Rx for percocet and breast milk she had pumped since admission.  She called to say she was coming by to pick it up.  It is now 3pm pt has not returned yet.  MT Rubye Oaks

## 2013-07-22 LAB — URINE DRUGS OF ABUSE SCREEN W ALC, ROUTINE (REF LAB)
Amphetamine Screen, Ur: NEGATIVE
Barbiturate Quant, Ur: NEGATIVE
Cocaine Metabolites: NEGATIVE
Creatinine,U: 110.9 mg/dL
Marijuana Metabolite: NEGATIVE
Methadone: NEGATIVE
Propoxyphene: NEGATIVE

## 2013-07-24 LAB — BENZODIAZEPINE, QUANTITATIVE, URINE
Alprazolam (GC/LC/MS), ur confirm: NEGATIVE ng/mL
Alprazolam metabolite (GC/LC/MS), ur confirm: NEGATIVE ng/mL
Estazolam (GC/LC/MS), ur confirm: NEGATIVE ng/mL
Flunitrazepam metabolite (GC/LC/MS), ur confirm: NEGATIVE ng/mL
Flurazepam GC/MS Conf: NEGATIVE ng/mL
Nordiazepam GC/MS Conf: NEGATIVE ng/mL
Oxazepam GC/MS Conf: NEGATIVE ng/mL
Temazepam GC/MS Conf: NEGATIVE ng/mL

## 2013-07-24 LAB — OPIATE, QUANTITATIVE, URINE
6 Monoacetylmorphine, Ur-Confirm: NEGATIVE ng/mL
Codeine Urine: NEGATIVE ng/mL
Morphine, Confirm: 2017 ng/mL
Oxycodone, ur: 58 ng/mL
Oxymorphone: 94 ng/mL

## 2013-08-21 ENCOUNTER — Ambulatory Visit: Payer: Self-pay | Admitting: Advanced Practice Midwife

## 2013-08-27 ENCOUNTER — Encounter: Payer: Self-pay | Admitting: Advanced Practice Midwife

## 2013-08-27 ENCOUNTER — Ambulatory Visit (INDEPENDENT_AMBULATORY_CARE_PROVIDER_SITE_OTHER): Payer: Medicaid Other | Admitting: Advanced Practice Midwife
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
## 2013-09-03 ENCOUNTER — Encounter: Payer: Self-pay | Admitting: *Deleted

## 2013-09-03 ENCOUNTER — Encounter: Payer: Medicaid Other | Admitting: Adult Health

## 2013-12-09 ENCOUNTER — Emergency Department (HOSPITAL_COMMUNITY): Payer: Medicaid Other

## 2013-12-09 ENCOUNTER — Encounter (HOSPITAL_COMMUNITY): Payer: Self-pay | Admitting: Emergency Medicine

## 2013-12-09 ENCOUNTER — Emergency Department (HOSPITAL_COMMUNITY)
Admission: EM | Admit: 2013-12-09 | Discharge: 2013-12-09 | Disposition: A | Discharge status: Home / Self Care | Payer: Medicaid Other | Attending: Emergency Medicine | Admitting: Emergency Medicine

## 2013-12-09 DIAGNOSIS — S60222A Contusion of left hand, initial encounter: Secondary | ICD-10-CM

## 2013-12-09 DIAGNOSIS — Y9389 Activity, other specified: Secondary | ICD-10-CM | POA: Insufficient documentation

## 2013-12-09 DIAGNOSIS — S60229A Contusion of unspecified hand, initial encounter: Secondary | ICD-10-CM | POA: Insufficient documentation

## 2013-12-09 DIAGNOSIS — IMO0002 Reserved for concepts with insufficient information to code with codable children: Secondary | ICD-10-CM | POA: Insufficient documentation

## 2013-12-09 DIAGNOSIS — Z8679 Personal history of other diseases of the circulatory system: Secondary | ICD-10-CM | POA: Insufficient documentation

## 2013-12-09 DIAGNOSIS — Z91040 Latex allergy status: Secondary | ICD-10-CM | POA: Insufficient documentation

## 2013-12-09 DIAGNOSIS — F17200 Nicotine dependence, unspecified, uncomplicated: Secondary | ICD-10-CM | POA: Insufficient documentation

## 2013-12-09 DIAGNOSIS — J449 Chronic obstructive pulmonary disease, unspecified: Secondary | ICD-10-CM | POA: Insufficient documentation

## 2013-12-09 DIAGNOSIS — Z8659 Personal history of other mental and behavioral disorders: Secondary | ICD-10-CM | POA: Insufficient documentation

## 2013-12-09 DIAGNOSIS — J4489 Other specified chronic obstructive pulmonary disease: Secondary | ICD-10-CM | POA: Insufficient documentation

## 2013-12-09 DIAGNOSIS — G8929 Other chronic pain: Secondary | ICD-10-CM | POA: Insufficient documentation

## 2013-12-09 DIAGNOSIS — Y929 Unspecified place or not applicable: Secondary | ICD-10-CM | POA: Insufficient documentation

## 2013-12-09 MED ORDER — NAPROXEN 500 MG PO TABS: 500.0000 mg | ORAL_TABLET | Freq: Two times a day (BID) | ORAL | Status: DC

## 2013-12-09 NOTE — ED Provider Notes (Signed)
CSN: [id number]     Arrival date & time 12/09/13  1506 History  This chart was scribed for Leslie Jakes, MD,  by Ashley Jacobs, ED Scribe. The patient was seen in room APA18/APA18 and the patient's care was started at 3:28 PM.    First MD Initiated Contact with Patient 12/09/13 1511     Chief Complaint  Patient presents with  . Hand Pain     (Consider location/radiation/quality/duration/timing/severity/associated sxs/prior Treatment) The history is provided by the patient and medical records. No language interpreter was used.   HPI Comments: Leslie Christian is a 38 y.o. female who presents to the Emergency Department complaining of left third and fourth digit pain with edema after hitting her hand last night. Pt mentions that he is unable to make a fist due to pain. Nothing seems to resolve the pain completely. Past Medical History  Diagnosis Date  . Pregnant   . Asthma   . COPD (chronic obstructive pulmonary disease)   . Atrial fibrillation   . Mitral valve prolapse   . Chronic back pain   . Chronic neck pain   . Migraine headache   . Anxiety   . Polysubstance abuse     Hx of cocaine, opiates, marijuana use   Past Surgical History  Procedure Laterality Date  . Cesarean section    . Endoscopy    . Cesarean section N/A 07/09/2013    Procedure: CESAREAN SECTION;  Surgeon: Reva Bores, MD;  Location: [facility];  Service: Obstetrics;  Laterality: N/A;  . Examination under anesthesia N/A 07/20/2013    Procedure: EXAM UNDER ANESTHESIA / Repair of traumatic dehisence of cesarean incision..;  Surgeon: Allie Bossier, MD;  Location: [facility];  Service: Gynecology;  Laterality: N/A;   Family History  Problem Relation Age of Onset  . COPD Mother   . Hypertension Mother   . Hyperlipidemia Mother   . Epilepsy Son    History  Substance Use Topics  . Smoking status: Current Every Day Smoker -- 0.25 packs/day for 24 years    Types: Cigarettes  . Smokeless tobacco: Never Used  .  Alcohol Use: No   OB History   Grav Para Term Preterm Abortions TAB SAB Ect Mult Living   8 4 1 3 4 2 2   4      Review of Systems  Constitutional: Negative for fever and chills.  HENT: Negative for rhinorrhea.   Respiratory: Positive for cough.   Gastrointestinal: Negative for nausea, vomiting and diarrhea.  Genitourinary: Negative for dysuria.  Musculoskeletal: Positive for arthralgias.       Third and fourth metacarpal pain  Skin: Negative for rash.  All other systems reviewed and are negative.      Allergies  Latex  Home Medications   Current Outpatient Rx  Name  Route  Sig  Dispense  Refill  . naproxen (NAPROSYN) 500 MG tablet   Oral   Take 1 tablet (500 mg total) by mouth 2 (two) times daily.   14 tablet   0    BP 108/57  Pulse 61  Temp(Src) 98.1 F (36.7 C) (Oral)  Resp 20  Ht 5\' 4"  (1.626 m)  Wt 130 lb (58.968 kg)  BMI 22.30 kg/m2  SpO2 95%  LMP 12/07/2013 Physical Exam  Nursing note and vitals reviewed. Constitutional: She is oriented to person, place, and time. She appears well-developed.  HENT:  Head: Normocephalic.  Eyes: Conjunctivae are normal.  Neck: No tracheal deviation present.  Cardiovascular: Normal rate.   Musculoskeletal: Normal range of motion. She exhibits edema and tenderness.   Left Third and fourth metacarpal mild edema No snuff box tenderness No wrist tenderness No elbow tenderness   Neurological: She is oriented to person, place, and time.  Skin: Skin is warm.  Psychiatric: She has a normal mood and affect.    ED Course  Procedures (including critical care time) DIAGNOSTIC STUDIES: Oxygen Saturation is 100% on room air, normal by my interpretation.    COORDINATION OF CARE:  3:31 PM Discussed course of care with pt which includes left hand x-ray. Pt understands and agrees.    Labs Review Labs Reviewed - No data to display Imaging Review Dg Hand Complete Left  12/09/2013   CLINICAL DATA:  Traumatic injury and  pain  EXAM: LEFT HAND - COMPLETE 3+ VIEW  COMPARISON:  None.  FINDINGS: There is no evidence of fracture or dislocation. There is no evidence of arthropathy or other focal bone abnormality. Soft tissues are unremarkable.  IMPRESSION: No acute abnormality noted.   Electronically Signed   By: Alcide CleverMark  Lukens M.D.   On: 12/09/2013 16:09    EKG Interpretation   None       MDM    Final diagnoses:  Contusion of hand, left   X-ray of left hand without any bony injuries. Represent sprain or contusion. We'll treat with anti-inflammatories.  I personally performed the services described in this documentation, which was scribed in my presence. The recorded information has been reviewed and is accurate.     Leslie JakesScott W. Jesson Foskey, MD 12/09/13 [phone]

## 2013-12-09 NOTE — Discharge Instructions (Signed)
Hand Contusion  A hand contusion is a deep bruise to the hand. Contusions happen when an injury causes bleeding under the skin. Signs of bruising include pain, puffiness (swelling), and discolored skin. The contusion may turn blue, purple, or yellow. HOME CARE  Put ice on the injured area.  Put ice in a plastic bag.  Place a towel between your skin and the bag.  Leave the ice on for 15-20 minutes, 03-04 times a day.  Only take medicines as told by your doctor.  Use an elastic wrap only as told. You may remove the wrap for sleeping, showering, and bathing. Take the wrap off if you lose feeling (have numbness) in your fingers, or they turn blue or cold. Put the wrap on more loosely.  Keep the hand raised (elevated) with pillows.  Avoid using your hand too much if it painful. GET HELP RIGHT AWAY IF:   You have more redness, puffiness, or pain in your hand.  Your puffiness or pain does not get better with medicine.  You lose feeling in your hand, or you cannot move your fingers.  Your hand turns cold or blue.  You have pain when you move your fingers.  Your hand feels warm.  Your contusion does not get better in 2 days. MAKE SURE YOU:   Understand these instructions.  Will watch this condition.  Will get help right away if you are not doing well or you get worse. Document Released: 04/03/2008 Document Revised: 07/10/2012 Document Reviewed: 04/08/2012 [facility] [employer] Patient Information 2014 [employer], [place].   X-ray negative for any bony injuries. Symptoms would be consistent with a sprain or contusion to the hand. Take anti-inflammatory medicine as directed. Return if not improved in 7 days. Return earlier if worse.
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
## 2014-01-03 ENCOUNTER — Encounter (HOSPITAL_COMMUNITY): Payer: Self-pay | Admitting: Emergency Medicine

## 2014-01-03 ENCOUNTER — Emergency Department (HOSPITAL_COMMUNITY)
Admission: EM | Admit: 2014-01-03 | Discharge: 2014-01-03 | Disposition: A | Discharge status: Home / Self Care | Payer: Medicaid Other | Attending: Emergency Medicine | Admitting: Emergency Medicine

## 2014-01-03 DIAGNOSIS — M542 Cervicalgia: Secondary | ICD-10-CM | POA: Insufficient documentation

## 2014-01-03 DIAGNOSIS — Z91040 Latex allergy status: Secondary | ICD-10-CM | POA: Insufficient documentation

## 2014-01-03 DIAGNOSIS — G8929 Other chronic pain: Secondary | ICD-10-CM | POA: Insufficient documentation

## 2014-01-03 DIAGNOSIS — Z8679 Personal history of other diseases of the circulatory system: Secondary | ICD-10-CM | POA: Insufficient documentation

## 2014-01-03 DIAGNOSIS — F1910 Other psychoactive substance abuse, uncomplicated: Secondary | ICD-10-CM | POA: Insufficient documentation

## 2014-01-03 DIAGNOSIS — J449 Chronic obstructive pulmonary disease, unspecified: Secondary | ICD-10-CM | POA: Insufficient documentation

## 2014-01-03 DIAGNOSIS — F411 Generalized anxiety disorder: Secondary | ICD-10-CM | POA: Insufficient documentation

## 2014-01-03 DIAGNOSIS — J4489 Other specified chronic obstructive pulmonary disease: Secondary | ICD-10-CM | POA: Insufficient documentation

## 2014-01-03 DIAGNOSIS — K029 Dental caries, unspecified: Secondary | ICD-10-CM | POA: Insufficient documentation

## 2014-01-03 DIAGNOSIS — K0381 Cracked tooth: Secondary | ICD-10-CM | POA: Insufficient documentation

## 2014-01-03 DIAGNOSIS — F17200 Nicotine dependence, unspecified, uncomplicated: Secondary | ICD-10-CM | POA: Insufficient documentation

## 2014-01-03 DIAGNOSIS — M549 Dorsalgia, unspecified: Secondary | ICD-10-CM | POA: Insufficient documentation

## 2014-01-03 MED ORDER — IBUPROFEN 800 MG PO TABS: 800.0000 mg | ORAL_TABLET | Freq: Once | ORAL | Status: DC

## 2014-01-03 NOTE — ED Provider Notes (Signed)
Medical screening examination/treatment/procedure(s) were performed by non-physician practitioner and as supervising physician I was immediately available for consultation/collaboration.   EKG Interpretation None        Layla MawKristen N Courtnee Myer, DO 01/03/14 2300
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
## 2014-01-03 NOTE — Discharge Instructions (Signed)
Dental Pain °A tooth ache may be caused by cavities (tooth decay). Cavities expose the nerve of the tooth to air and hot or cold temperatures. It may come from an infection or abscess (also called a boil or furuncle) around your tooth. It is also often caused by dental caries (tooth decay). This causes the pain you are having. °DIAGNOSIS  °Your caregiver can diagnose this problem by exam. °TREATMENT  °· If caused by an infection, it may be treated with medications which kill germs (antibiotics) and pain medications as prescribed by your caregiver. Take medications as directed. °· Only take over-the-counter or prescription medicines for pain, discomfort, or fever as directed by your caregiver. °· Whether the tooth ache today is caused by infection or dental disease, you should see your dentist as soon as possible for further care. °SEEK MEDICAL CARE IF: °The exam and treatment you received today has been provided on an emergency basis only. This is not a substitute for complete medical or dental care. If your problem worsens or new problems (symptoms) appear, and you are unable to meet with your dentist, call or return to this location. °SEEK IMMEDIATE MEDICAL CARE IF:  °· You have a fever. °· You develop redness and swelling of your face, jaw, or neck. °· You are unable to open your mouth. °· You have severe pain uncontrolled by pain medicine. °MAKE SURE YOU:  °· Understand these instructions. °· Will watch your condition. °· Will get help right away if you are not doing well or get worse. °Document Released: 10/16/2005 Document Revised: 01/08/2012 Document Reviewed: 06/03/2008 °ExitCare® Patient Information ©2014 [employer]. ° °Dental Caries  °Dental caries (also called tooth decay) is the most common oral disease. It can occur at any age, but is more common in children and young adults.  °HOW DENTAL CARIES DEVELOPS  °The process of decay begins when bacteria and foods (particularly sugars and starches) combine  in your mouth to produce plaque. Plaque is a substance that sticks to the hard, outer surface of a tooth (enamel). The bacteria in plaque produce acids that attack enamel. These acids may also attack the root surface of a tooth (cementum) if it is exposed. Repeated attacks dissolve these surfaces and create holes in the tooth (cavities). If left untreated, the acids destroy the other layers of the tooth.  °RISK FACTORS °· Frequent sipping of sugary beverages.   °· Frequent snacking on sugary and starchy foods, especially those that easily get stuck in the teeth.   °· Poor oral hygiene.   °· Dry mouth.   °· Substance abuse such as methamphetamine abuse.   °· Broken or poor-fitting dental restorations.   °· Eating disorders.   °· Gastroesophageal reflux disease (GERD).   °· Certain radiation treatments to the head and neck. °SYMPTOMS °In the early stages of dental caries, symptoms are seldom present. Sometimes white, chalky areas may be seen on the enamel or other tooth layers. In later stages, symptoms may include: °· Pits and holes on the enamel. °· Toothache after sweet, hot, or cold foods or drinks are consumed. °· Pain around the tooth. °· Swelling around the tooth. °DIAGNOSIS  °Most of the time, dental caries is detected during a regular dental checkup. A diagnosis is made after a thorough medical and dental history is taken and the surfaces of your teeth are checked for signs of dental caries. Sometimes special instruments, such as lasers, are used to check for dental caries. Dental X-ray exams may be taken so that areas not visible to the   eye (such as between the contact areas of the teeth) can be checked for cavities.  °TREATMENT  °If dental caries is in its early stages, it may be reversed with a fluoride treatment or an application of a remineralizing agent at the dental office. Thorough brushing and flossing at home is needed to aid these treatments. If it is in its later stages, treatment depends on the  location and extent of tooth destruction:  °· If a small area of the tooth has been destroyed, the destroyed area will be removed and cavities will be filled with a material such as gold, silver amalgam, or composite resin.   °· If a large area of the tooth has been destroyed, the destroyed area will be removed and a cap (crown) will be fitted over the remaining tooth structure.   °· If the center part of the tooth (pulp) is affected, a procedure called a root canal will be needed before a filling or crown can be placed.   °· If most of the tooth has been destroyed, the tooth may need to be pulled (extracted). °HOME CARE INSTRUCTIONS °You can prevent, stop, or reverse dental caries at home by practicing good oral hygiene. Good oral hygiene includes: °· Thoroughly cleaning your teeth at least twice a day with a toothbrush and dental floss.   °· Using a fluoride toothpaste. A fluoride mouth rinse may also be used if recommended by your dentist or health care provider.   °· Restricting the amount of sugary and starchy foods and sugary liquids you consume.   °· Avoiding frequent snacking on these foods and sipping of these liquids.   °· Keeping regular visits with a dentist for checkups and cleanings. °PREVENTION  °· Practice good oral hygiene. °· Consider a dental sealant. A dental sealant is a coating material that is applied by your dentist to the pits and grooves of teeth. The sealant prevents food from being trapped in them. It may protect the teeth for several years. °· Ask about fluoride supplements if you live in a community without fluorinated water or with water that has a low fluoride content. Use fluoride supplements as directed by your dentist or health care provider. °· Allow fluoride varnish applications to teeth if directed by your dentist or health care provider. °Document Released: 07/08/2002 Document Revised: 06/18/2013 Document Reviewed: 10/18/2012 °ExitCare® Patient Information ©2014 [employer],  LL[employer]. ° °

## 2014-01-03 NOTE — ED Notes (Signed)
Pt c/o broken teeth and severe pain since last pm

## 2014-01-03 NOTE — ED Notes (Addendum)
Pt not in room upon discharge. Pt left room in rant and walked out of triage area.

## 2014-01-04 ENCOUNTER — Encounter (HOSPITAL_COMMUNITY): Payer: Self-pay | Admitting: Emergency Medicine

## 2014-01-04 ENCOUNTER — Emergency Department (HOSPITAL_COMMUNITY)
Admission: EM | Admit: 2014-01-04 | Discharge: 2014-01-04 | Disposition: A | Discharge status: Home / Self Care | Payer: Medicaid Other | Attending: Emergency Medicine | Admitting: Emergency Medicine

## 2014-01-04 DIAGNOSIS — F17200 Nicotine dependence, unspecified, uncomplicated: Secondary | ICD-10-CM | POA: Insufficient documentation

## 2014-01-04 DIAGNOSIS — G8929 Other chronic pain: Secondary | ICD-10-CM | POA: Insufficient documentation

## 2014-01-04 DIAGNOSIS — Z91040 Latex allergy status: Secondary | ICD-10-CM | POA: Insufficient documentation

## 2014-01-04 DIAGNOSIS — J449 Chronic obstructive pulmonary disease, unspecified: Secondary | ICD-10-CM | POA: Insufficient documentation

## 2014-01-04 DIAGNOSIS — S025XXA Fracture of tooth (traumatic), initial encounter for closed fracture: Secondary | ICD-10-CM

## 2014-01-04 DIAGNOSIS — Z8679 Personal history of other diseases of the circulatory system: Secondary | ICD-10-CM | POA: Insufficient documentation

## 2014-01-04 DIAGNOSIS — K0381 Cracked tooth: Secondary | ICD-10-CM | POA: Insufficient documentation

## 2014-01-04 DIAGNOSIS — Z8659 Personal history of other mental and behavioral disorders: Secondary | ICD-10-CM | POA: Insufficient documentation

## 2014-01-04 DIAGNOSIS — J4489 Other specified chronic obstructive pulmonary disease: Secondary | ICD-10-CM | POA: Insufficient documentation

## 2014-01-04 MED ORDER — HYDROCODONE-ACETAMINOPHEN 5-325 MG PO TABS: 2.0000 | ORAL_TABLET | ORAL | Status: DC | PRN

## 2014-01-04 MED ORDER — PENICILLIN V POTASSIUM 500 MG PO TABS: 500.0000 mg | ORAL_TABLET | Freq: Four times a day (QID) | ORAL | Status: DC

## 2014-01-04 MED ORDER — NAPROXEN 500 MG PO TABS: 500.0000 mg | ORAL_TABLET | Freq: Two times a day (BID) | ORAL | Status: DC

## 2014-01-04 MED ORDER — NAPROXEN 250 MG PO TABS
500.0000 mg | ORAL_TABLET | Freq: Once | ORAL | Status: AC
  Administered 2014-01-04: 500 mg via ORAL
  Filled 2014-01-04: qty 2

## 2014-01-04 MED ORDER — HYDROCODONE-ACETAMINOPHEN 5-325 MG PO TABS
2.0000 | ORAL_TABLET | Freq: Once | ORAL | Status: AC
  Administered 2014-01-04: 2 via ORAL
  Filled 2014-01-04: qty 2

## 2014-01-04 NOTE — Discharge Instructions (Signed)
Please call your doctor for a followup appointment within 24-48 hours. When you talk to your doctor please let them know that you were seen in the emergency department and have them acquire all of your records so that they can discuss the findings with you and formulate a treatment plan to fully care for your new and ongoing problems. ° °

## 2014-01-04 NOTE — ED Notes (Signed)
Patient is trashing around on bed, quite histrionic behavior.  Extremely animated.  When asked if she was coming down off of something she and her significant other stated she wasn't on drugs.

## 2014-01-04 NOTE — ED Provider Notes (Signed)
CSN: [id number]     Arrival date & time 01/04/14  [id number] History   First MD Initiated Contact with Patient 01/04/14 [phone]     No chief complaint on file.    (Consider location/radiation/quality/duration/timing/severity/associated sxs/prior Treatment) HPI Comments: 38 year old female who presents to the hospital with a complaint of left dental pain. She states that yesterday she was chewing on a "jawbreaker" when she felt acute onset of pain in her left lower rear molar. This has been persistent, severe, worse with chewing and increased sensitivity to temperature. She has tried to call a dentist but has been unable to be scheduled for one until tomorrow. She has been seen in the emergency department but because of aggressive and inappropriate behavior she left without the treatment that was offered which was anti-inflammatories appropriately. She denies fevers or chills and has not vomiting.  The history is provided by the patient.    Past Medical History  Diagnosis Date  . Asthma   . COPD (chronic obstructive pulmonary disease)   . Atrial fibrillation   . Mitral valve prolapse   . Chronic back pain   . Chronic neck pain   . Migraine headache   . Anxiety   . Polysubstance abuse     Hx of cocaine, opiates, marijuana use   Past Surgical History  Procedure Laterality Date  . Cesarean section    . Endoscopy    . Cesarean section N/A 07/09/2013    Procedure: CESAREAN SECTION;  Surgeon: Reva Boresanya S Pratt, MD;  Location: [facility];  Service: Obstetrics;  Laterality: N/A;  . Examination under anesthesia N/A 07/20/2013    Procedure: EXAM UNDER ANESTHESIA / Repair of traumatic dehisence of cesarean incision..;  Surgeon: Allie BossierMyra C Dove, MD;  Location: [facility];  Service: Gynecology;  Laterality: N/A;   Family History  Problem Relation Age of Onset  . COPD Mother   . Hypertension Mother   . Hyperlipidemia Mother   . Epilepsy Son    History  Substance Use Topics  . Smoking status: Current Every Day  Smoker -- 0.25 packs/day for 24 years    Types: Cigarettes  . Smokeless tobacco: Never Used  . Alcohol Use: No   OB History   Grav Para Term Preterm Abortions TAB SAB Ect Mult Living   8 4 1 3 4 2 2   4      Review of Systems  Constitutional: Negative for fever.  HENT: Positive for dental problem. Negative for facial swelling.       Allergies  Latex  Home Medications   Current Outpatient Rx  Name  Route  Sig  Dispense  Refill  . acetaminophen (TYLENOL) 500 MG tablet   Oral   Take 1,000 mg by mouth every 6 (six) hours as needed for moderate pain.         Marland Kitchen. HYDROcodone-acetaminophen (NORCO/VICODIN) 5-325 MG per tablet   Oral   Take 2 tablets by mouth every 4 (four) hours as needed.   10 tablet   0   . naproxen (NAPROSYN) 500 MG tablet   Oral   Take 1 tablet (500 mg total) by mouth 2 (two) times daily with a meal.   30 tablet   0   . penicillin v potassium (VEETID) 500 MG tablet   Oral   Take 1 tablet (500 mg total) by mouth 4 (four) times daily.   40 tablet   0    BP 105/78  Pulse 78  Temp(Src) 96.1 F (35.6 C) (Oral)  Resp 18  SpO2 96%  LMP 12/30/2013 Physical Exam  Nursing note and vitals reviewed. Constitutional: She appears well-developed and well-nourished.  HENT:  Head: Normocephalic and atraumatic.  Mouth/Throat: Oropharynx is clear and moist. No oropharyngeal exudate.  Dental Disease - including a fracture of the left lower rear molar, this is on the posterior surface of the tooth, there is a jagged edge, there is no bleeding or obvious exposed pulp. There is no surrounding erythema of the gingiva, no abscesses, no jaw asymmetry, no trismus, no torticollis. No tenderness underneath the tongue, no pain with tongue protrusion, no change in phonation  Eyes: Conjunctivae are normal. No scleral icterus.  Neck: Normal range of motion. Neck supple. No thyromegaly present.  No lymphadenopathy  Cardiovascular: Normal rate and regular rhythm.    Pulmonary/Chest: Effort normal and breath sounds normal.  Lymphadenopathy:    She has no cervical adenopathy.  Neurological: She is alert.  Skin: Skin is warm and dry. No rash noted.    ED Course  Procedures (including critical care time) Labs Review Labs Reviewed - No data to display Imaging Review No results found.   EKG Interpretation None      MDM   Final diagnoses:  Broken tooth    The patient's behavior is somewhat troubling. She is agitated, erratic and moving her head from side to side making it very difficult to inspect her oral mucosa and dentition. It does appear that she has a fracture of her left lower rear molar. She is scheduled to see a dentist tomorrow according to her report. She will be given the medications as below and encouraged to followup. I would be hesitant to refill any of these medications on further visits as she does have a history of drug abuse and with her erratic behavior I would question her ongoing use of illegal substances. She and her significant other who is at the bedside with her adamantly deny that she is using any other medications.  Due to what appears to be acute severe pain she will be given a dose of hydrocodone and 10 tablets for home in addition to the antibiotics prescribed.  Meds given in ED:  Medications  HYDROcodone-acetaminophen (NORCO/VICODIN) 5-325 MG per tablet 2 tablet (2 tablets Oral Given 01/04/14 0648)  naproxen (NAPROSYN) tablet 500 mg (500 mg Oral Given 01/04/14 0648)    New Prescriptions   HYDROCODONE-ACETAMINOPHEN (NORCO/VICODIN) 5-325 MG PER TABLET    Take 2 tablets by mouth every 4 (four) hours as needed.   NAPROXEN (NAPROSYN) 500 MG TABLET    Take 1 tablet (500 mg total) by mouth 2 (two) times daily with a meal.   PENICILLIN V POTASSIUM (VEETID) 500 MG TABLET    Take 1 tablet (500 mg total) by mouth 4 (four) times daily.        Vida Roller, MD 01/04/14 [phone]

## 2014-01-04 NOTE — ED Notes (Signed)
Pt reports that she broke 3 teeth in the past couple of days.

## 2014-03-22 ENCOUNTER — Emergency Department (HOSPITAL_COMMUNITY)
Admission: EM | Admit: 2014-03-22 | Discharge: 2014-03-22 | Disposition: A | Discharge status: Home / Self Care | Payer: Medicaid Other | Attending: Emergency Medicine | Admitting: Emergency Medicine

## 2014-03-22 ENCOUNTER — Encounter (HOSPITAL_COMMUNITY): Payer: Self-pay | Admitting: Emergency Medicine

## 2014-03-22 DIAGNOSIS — I059 Rheumatic mitral valve disease, unspecified: Secondary | ICD-10-CM | POA: Insufficient documentation

## 2014-03-22 DIAGNOSIS — J4489 Other specified chronic obstructive pulmonary disease: Secondary | ICD-10-CM | POA: Insufficient documentation

## 2014-03-22 DIAGNOSIS — G43909 Migraine, unspecified, not intractable, without status migrainosus: Secondary | ICD-10-CM | POA: Insufficient documentation

## 2014-03-22 DIAGNOSIS — F17200 Nicotine dependence, unspecified, uncomplicated: Secondary | ICD-10-CM | POA: Insufficient documentation

## 2014-03-22 DIAGNOSIS — R197 Diarrhea, unspecified: Secondary | ICD-10-CM | POA: Insufficient documentation

## 2014-03-22 DIAGNOSIS — K219 Gastro-esophageal reflux disease without esophagitis: Secondary | ICD-10-CM | POA: Insufficient documentation

## 2014-03-22 DIAGNOSIS — Z9889 Other specified postprocedural states: Secondary | ICD-10-CM | POA: Insufficient documentation

## 2014-03-22 DIAGNOSIS — Z8659 Personal history of other mental and behavioral disorders: Secondary | ICD-10-CM | POA: Insufficient documentation

## 2014-03-22 DIAGNOSIS — R51 Headache: Secondary | ICD-10-CM | POA: Insufficient documentation

## 2014-03-22 DIAGNOSIS — Z91040 Latex allergy status: Secondary | ICD-10-CM | POA: Insufficient documentation

## 2014-03-22 DIAGNOSIS — J449 Chronic obstructive pulmonary disease, unspecified: Secondary | ICD-10-CM | POA: Insufficient documentation

## 2014-03-22 DIAGNOSIS — Z79899 Other long term (current) drug therapy: Secondary | ICD-10-CM | POA: Insufficient documentation

## 2014-03-22 DIAGNOSIS — G8929 Other chronic pain: Secondary | ICD-10-CM | POA: Insufficient documentation

## 2014-03-22 DIAGNOSIS — I4891 Unspecified atrial fibrillation: Secondary | ICD-10-CM | POA: Insufficient documentation

## 2014-03-22 DIAGNOSIS — R112 Nausea with vomiting, unspecified: Secondary | ICD-10-CM

## 2014-03-22 DIAGNOSIS — R519 Headache, unspecified: Secondary | ICD-10-CM

## 2014-03-22 MED ORDER — SODIUM CHLORIDE 0.9 % IV SOLN
1000.0000 mL | Freq: Once | INTRAVENOUS | Status: AC
Start: 2014-03-22 — End: 2014-03-22
  Administered 2014-03-22: 1000 mL via INTRAVENOUS

## 2014-03-22 MED ORDER — MAGNESIUM SULFATE 40 MG/ML IJ SOLN
2.0000 g | Freq: Once | INTRAMUSCULAR | Status: AC
  Administered 2014-03-22: 2 g via INTRAVENOUS
  Filled 2014-03-22: qty 50

## 2014-03-22 MED ORDER — ONDANSETRON HCL 4 MG/2ML IJ SOLN
4.0000 mg | Freq: Once | INTRAMUSCULAR | Status: AC
  Administered 2014-03-22: 4 mg via INTRAVENOUS
  Filled 2014-03-22: qty 2

## 2014-03-22 MED ORDER — VALPROATE SODIUM 500 MG/5ML IV SOLN
INTRAVENOUS | Status: AC
  Filled 2014-03-22: qty 10

## 2014-03-22 MED ORDER — DIPHENHYDRAMINE HCL 50 MG/ML IJ SOLN
25.0000 mg | Freq: Once | INTRAMUSCULAR | Status: AC
  Administered 2014-03-22: 25 mg via INTRAVENOUS
  Filled 2014-03-22: qty 1

## 2014-03-22 MED ORDER — SODIUM CHLORIDE 0.9 % IV SOLN: 1000.0000 mL | INTRAVENOUS | Status: DC

## 2014-03-22 MED ORDER — KETOROLAC TROMETHAMINE 30 MG/ML IJ SOLN
30.0000 mg | Freq: Once | INTRAMUSCULAR | Status: AC
  Administered 2014-03-22: 30 mg via INTRAVENOUS
  Filled 2014-03-22: qty 1

## 2014-03-22 MED ORDER — PROMETHAZINE HCL 25 MG PO TABS: 25.0000 mg | ORAL_TABLET | Freq: Three times a day (TID) | ORAL | Status: DC | PRN

## 2014-03-22 MED ORDER — SODIUM CHLORIDE 0.9 % IV SOLN
1000.0000 mL | Freq: Once | INTRAVENOUS | Status: AC
  Administered 2014-03-22: 1000 mL via INTRAVENOUS

## 2014-03-22 MED ORDER — DEXAMETHASONE SODIUM PHOSPHATE 4 MG/ML IJ SOLN
10.0000 mg | Freq: Once | INTRAMUSCULAR | Status: AC
  Administered 2014-03-22: 10 mg via INTRAVENOUS
  Filled 2014-03-22: qty 3

## 2014-03-22 MED ORDER — GI COCKTAIL ~~LOC~~
30.0000 mL | Freq: Once | ORAL | Status: AC
  Administered 2014-03-22: 30 mL via ORAL
  Filled 2014-03-22: qty 30

## 2014-03-22 MED ORDER — ONDANSETRON HCL 4 MG/2ML IJ SOLN
4.0000 mg | Freq: Once | INTRAMUSCULAR | Status: AC
  Administered 2014-03-22: 4 mg via INTRAVENOUS

## 2014-03-22 MED ORDER — METOCLOPRAMIDE HCL 5 MG/ML IJ SOLN
10.0000 mg | Freq: Once | INTRAMUSCULAR | Status: AC
  Administered 2014-03-22: 10 mg via INTRAVENOUS
  Filled 2014-03-22: qty 2

## 2014-03-22 MED ORDER — VALPROATE SODIUM 500 MG/5ML IV SOLN
1000.0000 mg | Freq: Once | INTRAVENOUS | Status: AC
  Administered 2014-03-22: 1000 mg via INTRAVENOUS
  Filled 2014-03-22: qty 10

## 2014-03-22 MED ORDER — ONDANSETRON HCL 4 MG/2ML IJ SOLN
4.0000 mg | Freq: Once | INTRAMUSCULAR | Status: DC
  Filled 2014-03-22: qty 2

## 2014-03-22 NOTE — ED Notes (Signed)
Also c/o generalized abd cramping and bil leg cramping.

## 2014-03-22 NOTE — ED Notes (Signed)
Pt states migraine, vomiting x 2 days, cramping to legs, generalized abdominal discomfort. Diarrhea began today.

## 2014-03-22 NOTE — ED Provider Notes (Signed)
CSN: [id number]     Arrival date & time 03/22/14  1644 History   First MD Initiated Contact with Patient 03/22/14 1719     Chief Complaint  Patient presents with  . Emesis     (Consider location/radiation/quality/duration/timing/severity/associated sxs/prior Treatment) HPI Patient reports she has a history of migraine headaches. She states she gets them at least weekly. She relates however lately they seem to be lasting longer. She reports she started having this migraine about 3 days ago. She states it's in the frontal and temporal areas. It is steady but also throbbing. She states bending over, bright lights, and noise make the headache hurt more. She states Excedrin Migraine used to make it feel better but it's not working with this headache. She has seen a neurologist in the past and was taking a pill to take for her headache only when they occurred. She was never on a preventative. She states she started vomiting yesterday and today she started having diarrhea x3 times. She states initially they were loose but now they're watery. She has some diffuse abdominal discomfort. She describes a lot of nausea. She states she feels dizzy and lightheaded. She denies any fever. She denies anybody else being sick that she has been around, she denies eating anything she thinks may have made her ill.  [facility]  Past Medical History  Diagnosis Date  . Asthma   . COPD (chronic obstructive pulmonary disease)   . Atrial fibrillation   . Mitral valve prolapse   . Chronic back pain   . Chronic neck pain   . Migraine headache   . Anxiety   . Polysubstance abuse     Hx of cocaine, opiates, marijuana use   Past Surgical History  Procedure Laterality Date  . Cesarean section    . Endoscopy    . Cesarean section N/A 07/09/2013    Procedure: CESAREAN SECTION;  Surgeon: Reva Bores, MD;  Location: [facility];  Service: Obstetrics;  Laterality: N/A;  . Examination under anesthesia N/A  07/20/2013    Procedure: EXAM UNDER ANESTHESIA / Repair of traumatic dehisence of cesarean incision..;  Surgeon: Allie Bossier, MD;  Location: [facility];  Service: Gynecology;  Laterality: N/A;   Family History  Problem Relation Age of Onset  . COPD Mother   . Hypertension Mother   . Hyperlipidemia Mother   . Epilepsy Son    History  Substance Use Topics  . Smoking status: Current Every Day Smoker -- 0.25 packs/day for 24 years    Types: Cigarettes  . Smokeless tobacco: Never Used  . Alcohol Use: No   Lives with spouse  OB History   Grav Para Term Preterm Abortions TAB SAB Ect Mult Living   8 4 1 3 4 2 2   4      Review of Systems  All other systems reviewed and are negative.     Allergies  Latex  Home Medications   Prior to Admission medications   Medication Sig Start Date End Date Taking? Authorizing Provider  aspirin-acetaminophen-caffeine (EXCEDRIN MIGRAINE) [phone] MG per tablet Take 2 tablets by mouth every 6 (six) hours as needed for headache or migraine.   Yes Historical Provider, MD   BP 130/83  Pulse 88  Temp(Src) 98.8 F (37.1 C) (Oral)  Resp 16  Ht 5\' 4"  (1.626 m)  Wt 128 lb (58.06 kg)  BMI 21.96 kg/m2  SpO2 100%  LMP 03/08/2014  Vital signs normal   Physical Exam  Nursing note and vitals reviewed. Constitutional: She is oriented to person, place, and time. She appears well-developed and well-nourished.  Non-toxic appearance. She does not appear ill. No distress.  HENT:  Head: Normocephalic and atraumatic.  Right Ear: External ear normal.  Left Ear: External ear normal.  Nose: Nose normal. No mucosal edema or rhinorrhea.  Mouth/Throat: Mucous membranes are normal. No dental abscesses or uvula swelling.  Dry tongue  Eyes: Conjunctivae and EOM are normal. Pupils are equal, round, and reactive to light.  Neck: Normal range of motion and full passive range of motion without pain. Neck supple.  Cardiovascular: Normal rate, regular rhythm and normal  heart sounds.  Exam reveals no gallop and no friction rub.   No murmur heard. Pulmonary/Chest: Effort normal and breath sounds normal. No respiratory distress. She has no wheezes. She has no rhonchi. She has no rales. She exhibits no tenderness and no crepitus.  Abdominal: Soft. Normal appearance and bowel sounds are normal. She exhibits no distension. There is no tenderness. There is no rebound and no guarding.  Musculoskeletal: Normal range of motion. She exhibits no edema and no tenderness.  Moves all extremities well.   Neurological: She is alert and oriented to person, place, and time. She has normal strength. No cranial nerve deficit.  Skin: Skin is warm, dry and intact. No rash noted. No erythema. No pallor.  Psychiatric: She has a normal mood and affect. Her speech is normal and behavior is normal. Her mood appears not anxious.    ED Course  Procedures (including critical care time)  Medications  0.9 %  sodium chloride infusion (0 mLs Intravenous Stopped 03/22/14 2034)    Followed by  0.9 %  sodium chloride infusion (1,000 mLs Intravenous New Bag/Given 03/22/14 2036)    Followed by  0.9 %  sodium chloride infusion (not administered)  ondansetron (ZOFRAN) injection 4 mg (not administered)  gi cocktail (Maalox,Lidocaine,Donnatal) (not administered)  metoCLOPramide (REGLAN) injection 10 mg (10 mg Intravenous Given 03/22/14 1757)  diphenhydrAMINE (BENADRYL) injection 25 mg (25 mg Intravenous Given 03/22/14 1757)  ondansetron (ZOFRAN) injection 4 mg (4 mg Intravenous Given 03/22/14 1757)  valproate (DEPACON) 1,000 mg in dextrose 5 % 50 mL IVPB (0 mg Intravenous Stopped 03/22/14 2022)  magnesium sulfate IVPB 2 g 50 mL (0 g Intravenous Stopped 03/22/14 1923)  ketorolac (TORADOL) 30 MG/ML injection 30 mg (30 mg Intravenous Given 03/22/14 1856)  dexamethasone (DECADRON) injection 10 mg (10 mg Intravenous Given 03/22/14 2102)   Patient continued to still complain of headache however she does not  appear in any distress.  2100 patient told her nurse that she was nauseated. It just walked out of the room and she did not complain of nausea. She also stated she had been complaining of heartburn however she had never told me she was having heartburn. Patient when asked does states she's been having heartburn. She is playing with her baby and appears to be in no distress. She is being discharged.  Labs Review Labs Reviewed - No data to display  Imaging Review No results found.   EKG Interpretation None      MDM   Final diagnoses:  Headache  Nausea vomiting and diarrhea  GERD (gastroesophageal reflux disease)   New Prescriptions   PROMETHAZINE (PHENERGAN) 25 MG TABLET    Take 1 tablet (25 mg total) by mouth every 8 (eight) hours as needed for nausea or vomiting.    Plan discharge  Devoria Albe, MD, Armando Gang  Ward GivensIva L Shoshana Johal, MD 03/22/14 2111

## 2014-03-22 NOTE — Discharge Instructions (Signed)
Drink plenty of fluids. Use the phenergan for nausea or vomiting. You can take either pepcid or prilosec OTC twice a day for the next 2 weeks then once a day for your heart burn. Avoid milk until your diarrhea is gone. Recheck as needed.  Diet for Gastroesophageal Reflux Disease, Adult Reflux (acid reflux) is when acid from your stomach flows up into the esophagus. When acid comes in contact with the esophagus, the acid causes irritation and soreness (inflammation) in the esophagus. When reflux happens often or so severely that it causes damage to the esophagus, it is called gastroesophageal reflux disease (GERD). Nutrition therapy can help ease the discomfort of GERD. FOODS OR DRINKS TO AVOID OR LIMIT  Smoking or chewing tobacco. Nicotine is one of the most potent stimulants to acid production in the gastrointestinal tract.  Caffeinated and decaffeinated coffee and black tea.  Regular or low-calorie carbonated beverages or energy drinks (caffeine-free carbonated beverages are allowed).   Strong spices, such as black pepper, white pepper, red pepper, cayenne, curry powder, and chili powder.  Peppermint or spearmint.  Chocolate.  High-fat foods, including meats and fried foods. Extra added fats including oils, butter, salad dressings, and nuts. Limit these to less than 8 tsp per day.  Fruits and vegetables if they are not tolerated, such as citrus fruits or tomatoes.  Alcohol.  Any food that seems to aggravate your condition. If you have questions regarding your diet, call your caregiver or a registered dietitian. OTHER THINGS THAT MAY HELP GERD INCLUDE:   Eating your meals slowly, in a relaxed setting.  Eating 5 to 6 small meals per day instead of 3 large meals.  Eliminating food for a period of time if it causes distress.  Not lying down until 3 hours after eating a meal.  Keeping the head of your bed raised 6 to 9 inches (15 to 23 cm) by using a foam wedge or blocks under the  legs of the bed. Lying flat may make symptoms worse.  Being physically active. Weight loss may be helpful in reducing reflux in overweight or obese adults.  Wear loose fitting clothing EXAMPLE MEAL PLAN This meal plan is approximately 2,000 calories based on https://www.[place].org/ChooseMyPlate.gov meal planning guidelines. Breakfast   cup cooked oatmeal.  1 cup strawberries.  1 cup low-fat milk.  1 oz almonds. Snack  1 cup cucumber slices.  6 oz yogurt (made from low-fat or fat-free milk). Lunch  2 slice whole-wheat bread.  2 oz sliced [place].  2 tsp mayonnaise.  1 cup blueberries.  1 cup snap peas. Snack  6 whole-wheat crackers.  1 oz string cheese. Dinner   cup brown rice.  1 cup mixed veggies.  1 tsp olive oil.  3 oz grilled fish. Document Released: 10/16/2005 Document Revised: 01/08/2012 Document Reviewed: 09/01/2011 [facility] Patient Information 2014 [place], [place].  Gastroesophageal Reflux Scan This is a scan used to check patients with symptoms of heartburn, regurgitation, vomiting, and problems swallowing. Regurgitation means that food is returning to the esophagus (the tube that carries food from mouth to stomach) from the stomach, after the food has been swallowed and is already in the stomach. In this procedure, a radioactive drink is given to the patient to see if it returns into the esophagus after being swallowed. This test may also be used to see if there is any stomach fluid drawn into the lungs. PREPARATION FOR TEST No preparation or fasting is necessary. NORMAL FINDINGS No evidence of gastroesophageal reflux. Ranges for normal  findings may vary among different laboratories and hospitals. You should always check with your doctor after having lab work or other tests done to discuss the meaning of your test results and whether your values are considered within normal limits. MEANING OF TEST  Your caregiver will go over the test results with you and discuss the  importance and meaning of your results, as well as treatment options and the need for additional tests if necessary. OBTAINING THE TEST RESULTS  It is your responsibility to obtain your test results. Ask the lab or department performing the test when and how you will get your results. Document Released: 12/07/2005 Document Revised: 01/08/2012 Document Reviewed: 09/26/2008 [facility] Patient Information 2014 [place], [place].

## 2014-03-22 NOTE — ED Notes (Addendum)
Patient complaining of nausea and burning in the center of chest and throat. States that she has been having this trouble over the past 3 weeks. MD notified.
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
## 2014-08-31 ENCOUNTER — Encounter (HOSPITAL_COMMUNITY): Payer: Self-pay | Admitting: Emergency Medicine

## 2014-10-08 ENCOUNTER — Encounter (HOSPITAL_COMMUNITY): Payer: Self-pay | Admitting: Emergency Medicine

## 2014-10-08 ENCOUNTER — Emergency Department (HOSPITAL_COMMUNITY): Payer: Medicaid Other

## 2014-10-08 ENCOUNTER — Emergency Department (HOSPITAL_COMMUNITY)
Admission: EM | Admit: 2014-10-08 | Discharge: 2014-10-08 | Disposition: A | Discharge status: Home / Self Care | Payer: Medicaid Other | Attending: Emergency Medicine | Admitting: Emergency Medicine

## 2014-10-08 DIAGNOSIS — R058 Other specified cough: Secondary | ICD-10-CM

## 2014-10-08 DIAGNOSIS — R05 Cough: Secondary | ICD-10-CM

## 2014-10-08 DIAGNOSIS — Z91040 Latex allergy status: Secondary | ICD-10-CM | POA: Insufficient documentation

## 2014-10-08 DIAGNOSIS — F419 Anxiety disorder, unspecified: Secondary | ICD-10-CM | POA: Insufficient documentation

## 2014-10-08 DIAGNOSIS — R51 Headache: Secondary | ICD-10-CM | POA: Insufficient documentation

## 2014-10-08 DIAGNOSIS — Z8679 Personal history of other diseases of the circulatory system: Secondary | ICD-10-CM | POA: Insufficient documentation

## 2014-10-08 DIAGNOSIS — G8929 Other chronic pain: Secondary | ICD-10-CM | POA: Insufficient documentation

## 2014-10-08 DIAGNOSIS — J441 Chronic obstructive pulmonary disease with (acute) exacerbation: Secondary | ICD-10-CM

## 2014-10-08 DIAGNOSIS — M549 Dorsalgia, unspecified: Secondary | ICD-10-CM | POA: Insufficient documentation

## 2014-10-08 DIAGNOSIS — R079 Chest pain, unspecified: Secondary | ICD-10-CM | POA: Insufficient documentation

## 2014-10-08 MED ORDER — ALBUTEROL SULFATE HFA 108 (90 BASE) MCG/ACT IN AERS
2.0000 | INHALATION_SPRAY | Freq: Once | RESPIRATORY_TRACT | Status: AC
  Administered 2014-10-08: 2 via RESPIRATORY_TRACT
  Filled 2014-10-08: qty 6.7

## 2014-10-08 MED ORDER — IPRATROPIUM-ALBUTEROL 0.5-2.5 (3) MG/3ML IN SOLN
3.0000 mL | Freq: Once | RESPIRATORY_TRACT | Status: AC
  Administered 2014-10-08: 3 mL via RESPIRATORY_TRACT
  Filled 2014-10-08: qty 3

## 2014-10-08 MED ORDER — PHENYLEPH-PROMETHAZINE-COD 5-6.25-10 MG/5ML PO SYRP: ORAL_SOLUTION | ORAL | Status: DC

## 2014-10-08 MED ORDER — PREDNISONE 50 MG PO TABS
60.0000 mg | ORAL_TABLET | Freq: Once | ORAL | Status: AC
  Administered 2014-10-08: 60 mg via ORAL
  Filled 2014-10-08 (×2): qty 1

## 2014-10-08 MED ORDER — PREDNISONE 20 MG PO TABS: 40.0000 mg | ORAL_TABLET | Freq: Every day | ORAL | Status: DC

## 2014-10-08 NOTE — Care Management Note (Signed)
ED/CM noted patient did not have health insurance and/or PCP listed in the computer.  Patient was given the [place] resource handout with information on the clinics, food pantries, and the handout for new health insurance sign-up.  Patient expressed appreciation for information received. 

## 2014-10-08 NOTE — Discharge Instructions (Signed)
Please increase fluids. Please use 2 pills from your albuterol inhaler every 4 hours. Please use prednisone daily with food until all taken. Use the promethazine cough medication every 6 hours if needed for cough and congestion. This medication may cause drowsiness, please use with caution. Chronic Obstructive Pulmonary Disease Exacerbation Chronic obstructive pulmonary disease (COPD) is a common lung condition in which airflow from the lungs is limited. COPD is a general term that can be used to describe many different lung problems that limit airflow, including chronic bronchitis and emphysema. COPD exacerbations are episodes when breathing symptoms become much worse and require extra treatment. Without treatment, COPD exacerbations can be life threatening, and frequent COPD exacerbations can cause further damage to your lungs. CAUSES   Respiratory infections.   Exposure to smoke.   Exposure to air pollution, chemical fumes, or dust. Sometimes there is no apparent cause or trigger. RISK FACTORS  Smoking cigarettes.  Older age.  Frequent prior COPD exacerbations. SIGNS AND SYMPTOMS   Increased coughing.   Increased thick spit (sputum) production.   Increased wheezing.   Increased shortness of breath.   Rapid breathing.   Chest tightness. DIAGNOSIS  Your medical history, a physical exam, and tests will help your health care provider make a diagnosis. Tests may include:  A chest X-ray.  Basic lab tests.  Sputum testing.  An arterial blood gas test. TREATMENT  Depending on the severity of your COPD exacerbation, you may need to be admitted to a hospital for treatment. Some of the treatments commonly used to treat COPD exacerbations are:   Antibiotic medicines.   Bronchodilators. These are drugs that expand the air passages. They may be given with an inhaler or nebulizer. Spacer devices may be needed to help improve drug delivery.  Corticosteroid  medicines.  Supplemental oxygen therapy.  HOME CARE INSTRUCTIONS   Do not smoke. Quitting smoking is very important to prevent COPD from getting worse and exacerbations from happening as often.  Avoid exposure to all substances that irritate the airway, especially to tobacco smoke.   If you were prescribed an antibiotic medicine, finish it all even if you start to feel better.  Take all medicines as directed by your health care provider.It is important to use correct technique with inhaled medicines.  Drink enough fluids to keep your urine clear or pale yellow (unless you have a medical condition that requires fluid restriction).  Use a cool mist vaporizer. This makes it easier to clear your chest when you cough.   If you have a home nebulizer and oxygen, continue to use them as directed.   Maintain all necessary vaccinations to prevent infections.   Exercise regularly.   Eat a healthy diet.   Keep all follow-up appointments as directed by your health care provider. SEEK IMMEDIATE MEDICAL CARE IF:  You have worsening shortness of breath.   You have trouble talking.   You have severe chest pain.  You have blood in your sputum.  You have a fever.  You have weakness, vomit repeatedly, or faint.   You feel confused.   You continue to get worse. MAKE SURE YOU:   Understand these instructions.  Will watch your condition.  Will get help right away if you are not doing well or get worse. Document Released: 08/13/2007 Document Revised: 03/02/2014 Document Reviewed: 06/20/2013 [facility] Patient Information 2015 [place], [place]. This information is not intended to replace advice given to you by your health care provider. Make sure you discuss any questions you  have with your health care provider. ° °

## 2014-10-08 NOTE — ED Notes (Signed)
Pt c/o worsening productive cough yellow sputum and cp with breathing x 2 days.

## 2014-10-08 NOTE — ED Provider Notes (Signed)
CSN: [id number]     Arrival date & time 10/08/14  1150 History   First MD Initiated Contact with Patient 10/08/14 1309     Chief Complaint  Patient presents with  . Cough     (Consider location/radiation/quality/duration/timing/severity/associated sxs/prior Treatment) Patient is a 38 y.o. female presenting with cough. The history is provided by the patient.  Cough Cough characteristics:  Productive Sputum characteristics:  Yellow Severity:  Moderate Onset quality:  Gradual Duration:  2 days Timing:  Intermittent Progression:  Worsening Chronicity:  New Smoker: yes   Context: sick contacts and weather changes   Worsened by:  Nothing tried Ineffective treatments: OTC meds. Associated symptoms: chest pain, headaches, myalgias, sinus congestion and wheezing   Associated symptoms: no eye discharge, no rash and no shortness of breath   Risk factors: no recent travel     Past Medical History  Diagnosis Date  . Asthma   . COPD (chronic obstructive pulmonary disease)   . Atrial fibrillation   . Mitral valve prolapse   . Chronic back pain   . Chronic neck pain   . Migraine headache   . Anxiety   . Polysubstance abuse     Hx of cocaine, opiates, marijuana use   Past Surgical History  Procedure Laterality Date  . Cesarean section    . Endoscopy    . Cesarean section N/A 07/09/2013    Procedure: CESAREAN SECTION;  Surgeon: Reva Boresanya S Pratt, MD;  Location: [facility];  Service: Obstetrics;  Laterality: N/A;  . Examination under anesthesia N/A 07/20/2013    Procedure: EXAM UNDER ANESTHESIA / Repair of traumatic dehisence of cesarean incision..;  Surgeon: Allie BossierMyra C Dove, MD;  Location: [facility];  Service: Gynecology;  Laterality: N/A;   Family History  Problem Relation Age of Onset  . COPD Mother   . Hypertension Mother   . Hyperlipidemia Mother   . Epilepsy Son    History  Substance Use Topics  . Smoking status: Current Every Day Smoker -- 0.25 packs/day for 24 years    Types:  Cigarettes  . Smokeless tobacco: Never Used  . Alcohol Use: No   OB History    Gravida Para Term Preterm AB TAB SAB Ectopic Multiple Living   8 4 1 3 4 2 2   4      Review of Systems  Constitutional: Negative for activity change.       All ROS Neg except as noted in HPI  HENT: Negative for nosebleeds.   Eyes: Negative for photophobia and discharge.  Respiratory: Positive for cough and wheezing. Negative for shortness of breath.   Cardiovascular: Positive for chest pain. Negative for palpitations.  Gastrointestinal: Negative for abdominal pain and blood in stool.  Genitourinary: Negative for dysuria, frequency and hematuria.  Musculoskeletal: Positive for myalgias and back pain. Negative for arthralgias and neck pain.  Skin: Negative.  Negative for rash.  Neurological: Positive for headaches. Negative for dizziness, seizures and speech difficulty.  Psychiatric/Behavioral: Negative for hallucinations and confusion. The patient is nervous/anxious.       Allergies  Latex  Home Medications   Prior to Admission medications   Medication Sig Start Date End Date Taking? Authorizing Provider  aspirin-acetaminophen-caffeine (EXCEDRIN MIGRAINE) [phone] MG per tablet Take 2 tablets by mouth every 6 (six) hours as needed for headache or migraine.    Historical Provider, MD  promethazine (PHENERGAN) 25 MG tablet Take 1 tablet (25 mg total) by mouth every 8 (eight) hours as needed for nausea or vomiting.  03/22/14   Ward GivensIva L Knapp, MD   Pulse 76  Temp(Src) 98.8 F (37.1 C)  Resp 18  Ht 5\' 4"  (1.626 m)  Wt 130 lb (58.968 kg)  BMI 22.30 kg/m2  SpO2 100%  LMP 09/29/2014 Physical Exam  Constitutional: She is oriented to person, place, and time. She appears well-developed and well-nourished.  Non-toxic appearance.  HENT:  Head: Normocephalic.  Right Ear: Tympanic membrane and external ear normal.  Left Ear: Tympanic membrane and external ear normal.  Nasal congestion present.  Eyes: EOM  and lids are normal. Pupils are equal, round, and reactive to light.  Neck: Normal range of motion. Neck supple. Carotid bruit is not present.  Cardiovascular: Normal rate, regular rhythm, normal heart sounds, intact distal pulses and normal pulses.   Pulmonary/Chest: Effort normal. No accessory muscle usage. No respiratory distress. She has wheezes. She has rhonchi.  Abdominal: Soft. Bowel sounds are normal. There is no tenderness. There is no guarding.  Musculoskeletal: Normal range of motion.  Lymphadenopathy:       Head (right side): No submandibular adenopathy present.       Head (left side): No submandibular adenopathy present.    She has no cervical adenopathy.  Neurological: She is alert and oriented to person, place, and time. She has normal strength. No cranial nerve deficit or sensory deficit.  Skin: Skin is warm and dry.  Psychiatric: She has a normal mood and affect. Her speech is normal.  Nursing note and vitals reviewed.   ED Course  Procedures (including critical care time) Labs Review Labs Reviewed - No data to display  Imaging Review No results found.   EKG Interpretation None      MDM  Pt improved after neb tx, but wheezing not resolved. Albuterol inhaler 2 puffs given with more improvement in breathing and wheezing. Pt speaking in complete sentences at d/c. Rx for promethazine-codeine VC cough med, and prednisone given to the patient. Pulse ox 97% on room air at d/c. Feel it is safe for pt to be discharged home.   Final diagnoses:  Cough productive of purulent sputum    **I have reviewed nursing notes, vital signs, and all appropriate lab and imaging results for this patient.Kathie Dike*    Aino Heckert M Sefora Tietje, PA-C 10/09/14 1653  Donnetta HutchingBrian Cook, MD 10/10/14 [phone]

## 2014-10-30 NOTE — L&D Delivery Note (Signed)
Delivery Note At 1:40 AM a viable female was delivered precipitously via VBAC, Spontaneous (Presentation: OA ).  APGAR: 8, 9; weight 5 lb 2.5 oz (2340 g).   Placenta status: Intact, Spontaneous.  Cord: 3 vessels   Anesthesia: None  Episiotomy: None Lacerations: None Est. Blood Loss (mL): 175  Mom to postpartum.  Baby to Couplet care / Skin to Skin.   Margarette Vannatter CNM 08/14/2015, 2:10 AM

## 2014-12-29 ENCOUNTER — Emergency Department: Payer: Self-pay | Admitting: Emergency Medicine

## 2015-03-04 ENCOUNTER — Emergency Department (HOSPITAL_COMMUNITY)
Admission: EM | Admit: 2015-03-04 | Discharge: 2015-03-04 | Disposition: A | Discharge status: Home / Self Care | Payer: Medicaid Other | Attending: Emergency Medicine | Admitting: Emergency Medicine

## 2015-03-04 ENCOUNTER — Emergency Department (HOSPITAL_COMMUNITY): Payer: Medicaid Other

## 2015-03-04 ENCOUNTER — Encounter (HOSPITAL_COMMUNITY): Payer: Self-pay

## 2015-03-04 DIAGNOSIS — O9952 Diseases of the respiratory system complicating childbirth: Secondary | ICD-10-CM | POA: Insufficient documentation

## 2015-03-04 DIAGNOSIS — Z3A16 16 weeks gestation of pregnancy: Secondary | ICD-10-CM | POA: Insufficient documentation

## 2015-03-04 DIAGNOSIS — G43909 Migraine, unspecified, not intractable, without status migrainosus: Secondary | ICD-10-CM | POA: Insufficient documentation

## 2015-03-04 DIAGNOSIS — Z79899 Other long term (current) drug therapy: Secondary | ICD-10-CM | POA: Insufficient documentation

## 2015-03-04 DIAGNOSIS — G8929 Other chronic pain: Secondary | ICD-10-CM | POA: Insufficient documentation

## 2015-03-04 DIAGNOSIS — O9989 Other specified diseases and conditions complicating pregnancy, childbirth and the puerperium: Secondary | ICD-10-CM | POA: Insufficient documentation

## 2015-03-04 DIAGNOSIS — O99332 Smoking (tobacco) complicating pregnancy, second trimester: Secondary | ICD-10-CM | POA: Diagnosis not present

## 2015-03-04 DIAGNOSIS — Z8709 Personal history of other diseases of the respiratory system: Secondary | ICD-10-CM

## 2015-03-04 DIAGNOSIS — Z8659 Personal history of other mental and behavioral disorders: Secondary | ICD-10-CM | POA: Diagnosis not present

## 2015-03-04 DIAGNOSIS — R079 Chest pain, unspecified: Secondary | ICD-10-CM | POA: Diagnosis not present

## 2015-03-04 DIAGNOSIS — J449 Chronic obstructive pulmonary disease, unspecified: Secondary | ICD-10-CM | POA: Insufficient documentation

## 2015-03-04 DIAGNOSIS — O99352 Diseases of the nervous system complicating pregnancy, second trimester: Secondary | ICD-10-CM | POA: Insufficient documentation

## 2015-03-04 DIAGNOSIS — F17210 Nicotine dependence, cigarettes, uncomplicated: Secondary | ICD-10-CM | POA: Insufficient documentation

## 2015-03-04 DIAGNOSIS — R091 Pleurisy: Secondary | ICD-10-CM

## 2015-03-04 DIAGNOSIS — Z91040 Latex allergy status: Secondary | ICD-10-CM | POA: Insufficient documentation

## 2015-03-04 DIAGNOSIS — Z3490 Encounter for supervision of normal pregnancy, unspecified, unspecified trimester: Secondary | ICD-10-CM

## 2015-03-04 LAB — CBC WITH DIFFERENTIAL/PLATELET
BASOS ABS: 0 10*3/uL (ref 0.0–0.1)
Basophils Relative: 0 % (ref 0–1)
Eosinophils Absolute: 0.2 10*3/uL (ref 0.0–0.7)
Eosinophils Relative: 2 % (ref 0–5)
HEMATOCRIT: 33.6 % — AB (ref 36.0–46.0)
Hemoglobin: 11.7 g/dL — ABNORMAL LOW (ref 12.0–15.0)
LYMPHS ABS: 2.8 10*3/uL (ref 0.7–4.0)
LYMPHS PCT: 19 % (ref 12–46)
MCH: 32.5 pg (ref 26.0–34.0)
MCHC: 34.8 g/dL (ref 30.0–36.0)
MCV: 93.3 fL (ref 78.0–100.0)
MONO ABS: 0.9 10*3/uL (ref 0.1–1.0)
Monocytes Relative: 6 % (ref 3–12)
Neutro Abs: 11.2 10*3/uL — ABNORMAL HIGH (ref 1.7–7.7)
Neutrophils Relative %: 74 % (ref 43–77)
Platelets: 215 10*3/uL (ref 150–400)
RBC: 3.6 MIL/uL — ABNORMAL LOW (ref 3.87–5.11)
RDW: 13.2 % (ref 11.5–15.5)
WBC: 15.1 10*3/uL — AB (ref 4.0–10.5)

## 2015-03-04 LAB — COMPREHENSIVE METABOLIC PANEL
ALBUMIN: 3.4 g/dL — AB (ref 3.5–5.0)
ALK PHOS: 52 U/L (ref 38–126)
ALT: 11 U/L — ABNORMAL LOW (ref 14–54)
ANION GAP: 8 (ref 5–15)
AST: 13 U/L — ABNORMAL LOW (ref 15–41)
BUN: 7 mg/dL (ref 6–20)
CHLORIDE: 105 mmol/L (ref 101–111)
CO2: 22 mmol/L (ref 22–32)
CREATININE: 0.54 mg/dL (ref 0.44–1.00)
Calcium: 8.6 mg/dL — ABNORMAL LOW (ref 8.9–10.3)
GFR calc Af Amer: >60 mL/min (ref >60)
GFR calc non Af Amer: >60 mL/min (ref >60)
Glucose, Bld: 94 mg/dL (ref 70–99)
POTASSIUM: 3.3 mmol/L — AB (ref 3.5–5.1)
Sodium: 135 mmol/L (ref 135–145)
TOTAL PROTEIN: 6.3 g/dL — AB (ref 6.5–8.1)
Total Bilirubin: 0.5 mg/dL (ref 0.3–1.2)

## 2015-03-04 LAB — TROPONIN I

## 2015-03-04 LAB — D-DIMER, QUANTITATIVE (NOT AT ARMC): D DIMER QUANT: 0.47 ug{FEU}/mL (ref 0.00–0.48)

## 2015-03-04 MED ORDER — ALBUTEROL SULFATE (2.5 MG/3ML) 0.083% IN NEBU
2.5000 mg | INHALATION_SOLUTION | Freq: Once | RESPIRATORY_TRACT | Status: AC
  Administered 2015-03-04: 2.5 mg via RESPIRATORY_TRACT

## 2015-03-04 MED ORDER — IPRATROPIUM-ALBUTEROL 0.5-2.5 (3) MG/3ML IN SOLN
RESPIRATORY_TRACT | Status: AC
  Filled 2015-03-04: qty 3

## 2015-03-04 MED ORDER — PREDNISONE 10 MG PO TABS: 20.0000 mg | ORAL_TABLET | Freq: Two times a day (BID) | ORAL | Status: DC

## 2015-03-04 MED ORDER — KETOROLAC TROMETHAMINE 30 MG/ML IJ SOLN: 30.0000 mg | Freq: Once | INTRAMUSCULAR | Status: DC

## 2015-03-04 MED ORDER — IPRATROPIUM-ALBUTEROL 0.5-2.5 (3) MG/3ML IN SOLN
3.0000 mL | Freq: Once | RESPIRATORY_TRACT | Status: AC
  Administered 2015-03-04: 3 mL via RESPIRATORY_TRACT

## 2015-03-04 MED ORDER — ALBUTEROL SULFATE (2.5 MG/3ML) 0.083% IN NEBU
5.0000 mg | INHALATION_SOLUTION | Freq: Once | RESPIRATORY_TRACT | Status: DC
  Filled 2015-03-04: qty 6

## 2015-03-04 MED ORDER — IPRATROPIUM BROMIDE 0.02 % IN SOLN: 0.5000 mg | Freq: Once | RESPIRATORY_TRACT | Status: DC

## 2015-03-04 NOTE — ED Provider Notes (Signed)
CSN: [id number]     Arrival date & time 03/04/15  [id number] History   First MD Initiated Contact with Patient 03/04/15 [phone]     Chief Complaint  Patient presents with  . Chest Pain     (Consider location/radiation/quality/duration/timing/severity/associated sxs/prior Treatment) HPI Comments: Patient is a 39 year old female with history of COPD and atrial fibrillation. She presents for evaluation of pain in her right shoulder and right upper chest that started yesterday morning and has rapidly worsened. She reports this pain as sharp and worse with movement, palpation, and breathing. She has tried breathing treatments at home with little relief. She denies fevers or chills.  Patient is a 39 y.o. female presenting with chest pain. The history is provided by the patient.  Chest Pain Pain location:  R chest Pain quality: sharp   Pain radiates to:  R shoulder Pain radiates to the back: no   Pain severity:  Moderate Onset quality:  Sudden Duration:  24 hours Timing:  Constant Progression:  Worsening Chronicity:  New Context: breathing and movement   Relieved by:  Nothing Worsened by:  Coughing, movement, certain positions and deep breathing Ineffective treatments:  None tried Associated symptoms: no fatigue, no fever and no palpitations     Past Medical History  Diagnosis Date  . Asthma   . COPD (chronic obstructive pulmonary disease)   . Atrial fibrillation   . Mitral valve prolapse   . Chronic back pain   . Chronic neck pain   . Migraine headache   . Anxiety   . Polysubstance abuse     Hx of cocaine, opiates, marijuana use   Past Surgical History  Procedure Laterality Date  . Cesarean section    . Endoscopy    . Cesarean section N/A 07/09/2013    Procedure: CESAREAN SECTION;  Surgeon: Reva Boresanya S Pratt, MD;  Location: [facility];  Service: Obstetrics;  Laterality: N/A;  . Examination under anesthesia N/A 07/20/2013    Procedure: EXAM UNDER ANESTHESIA / Repair of traumatic dehisence of  cesarean incision..;  Surgeon: Allie BossierMyra C Dove, MD;  Location: [facility];  Service: Gynecology;  Laterality: N/A;   Family History  Problem Relation Age of Onset  . COPD Mother   . Hypertension Mother   . Hyperlipidemia Mother   . Epilepsy Son    History  Substance Use Topics  . Smoking status: Current Every Day Smoker -- 0.25 packs/day for 24 years    Types: Cigarettes  . Smokeless tobacco: Never Used  . Alcohol Use: No   OB History    Gravida Para Term Preterm AB TAB SAB Ectopic Multiple Living   9 4 1 3 4 2 2   4      Review of Systems  Constitutional: Negative for fever and fatigue.  Cardiovascular: Positive for chest pain. Negative for palpitations.  All other systems reviewed and are negative.     Allergies  Latex  Home Medications   Prior to Admission medications   Medication Sig Start Date End Date Taking? Authorizing Provider  acetaminophen (TYLENOL) 325 MG tablet Take 650 mg by mouth every 6 (six) hours as needed.   Yes Historical Provider, MD  albuterol (PROVENTIL HFA;VENTOLIN HFA) 108 (90 BASE) MCG/ACT inhaler Inhale into the lungs every 6 (six) hours as needed for wheezing or shortness of breath.   Yes Historical Provider, MD  Prenatal Vit-Fe Fumarate-FA (PRENATAL MULTIVITAMIN) TABS tablet Take 1 tablet by mouth daily at 12 noon.   Yes Historical Provider, MD  aspirin-acetaminophen-caffeine [facility](EXCEDRIN  MIGRAINE) 250-250-65 MG per tablet Take 2 tablets by mouth every 6 (six) hours as needed for headache or migraine.    Historical Provider, MD  Phenyleph-Promethazine-Cod 5-6.25-10 MG/5ML SYRP 5ml po q6h prn cough/congestion 10/08/14   Ivery QualeHobson Bryant, PA-C  predniSONE (DELTASONE) 20 MG tablet Take 2 tablets (40 mg total) by mouth daily. 10/08/14   Ivery QualeHobson Bryant, PA-C  promethazine (PHENERGAN) 25 MG tablet Take 1 tablet (25 mg total) by mouth every 8 (eight) hours as needed for nausea or vomiting. Patient not taking: Reported on 10/08/2014 03/22/14   Devoria AlbeIva Knapp, MD   BP  114/75 mmHg  Pulse 65  Temp(Src) 98 F (36.7 C) (Oral)  Resp 22  Ht 5\' 4"  (1.626 m)  Wt 130 lb (58.968 kg)  BMI 22.30 kg/m2  SpO2 98%  LMP 09/29/2014 Physical Exam  Constitutional: She is oriented to person, place, and time. She appears well-developed and well-nourished. No distress.  HENT:  Head: Normocephalic and atraumatic.  Mouth/Throat: Oropharynx is clear and moist.  Neck: Normal range of motion. Neck supple.  Cardiovascular: Normal rate and regular rhythm.  Exam reveals no gallop and no friction rub.   No murmur heard. Pulmonary/Chest: Effort normal and breath sounds normal. No respiratory distress. She has no wheezes. She has no rales. She exhibits tenderness.  There is tenderness to palpation to the right upper chest.  Abdominal: Soft. Bowel sounds are normal. She exhibits no distension. There is no tenderness.  Musculoskeletal: Normal range of motion.  Neurological: She is alert and oriented to person, place, and time.  Skin: Skin is warm and dry. She is not diaphoretic.  Nursing note and vitals reviewed.   ED Course  Procedures (including critical care time) Labs Review Labs Reviewed  COMPREHENSIVE METABOLIC PANEL  CBC WITH DIFFERENTIAL/PLATELET  TROPONIN I  D-DIMER, QUANTITATIVE    Imaging Review No results found.  ED ECG REPORT   Date: 03/04/2015  Rate: 69  Rhythm: normal sinus rhythm  QRS Axis: normal  Intervals: normal  ST/T Wave abnormalities: normal  Conduction Disutrbances:none  Narrative Interpretation:   Old EKG Reviewed: none available  I have personally reviewed the EKG tracing and agree with the computerized printout as noted.   MDM   Final diagnoses:  None    Patient presents here with complaints of right shoulder and right-sided chest pain that started approximately 24 hours prior to presentation. She has a history of pleurisy and believes this feels the same. Her workup reveals a negative d-dimer, negative troponin, and normal  EKG. Her chest x-ray shows no acute process. She appears very anxious and is tearful.  This patient failed to mention the fact that she was approximately 4 months pregnant until just before discharge. She also has a history of polysubstance abuse. I will recommend Tylenol 1000 mg every 6 hours as needed for her pain and a short course of prednisone as she states that this has helped her in the past.    Geoffery Lyonsouglas Kalie Cabral, MD 03/04/15 [phone]

## 2015-03-04 NOTE — ED Notes (Signed)
Pt reports sharp pain that started in her right shoulder and now is in the center of her chest, hard to breathe at times,

## 2015-03-04 NOTE — Discharge Instructions (Signed)
Prednisone as prescribed.  Tylenol 1000 mg every 6 hours as needed for pain.  Return to the ER if your symptoms significantly worsen or change.   Pleurisy Pleurisy is an inflammation and swelling of the lining of the lungs (pleura). Because of this inflammation, it hurts to breathe. It can be aggravated by coughing, laughing, or deep breathing. Pleurisy is often caused by an underlying infection or disease.  HOME CARE INSTRUCTIONS  Monitor your pleurisy for any changes. The following actions may help to alleviate any discomfort you are experiencing:  Medicine may help with pain. Only take over-the-counter or prescription medicines for pain, discomfort, or fever as directed by your health care provider.  Only take antibiotic medicine as directed. Make sure to finish it even if you start to feel better. SEEK MEDICAL CARE IF:   Your pain is not controlled with medicine or is increasing.  You have an increase in pus-like (purulent) secretions brought up with coughing. SEEK IMMEDIATE MEDICAL CARE IF:   You have blue or dark lips, fingernails, or toenails.  You are coughing up blood.  You have increased difficulty breathing.  You have continuing pain unrelieved by medicine or pain lasting more than 1 week.  You have pain that radiates into your neck, arms, or jaw.  You develop increased shortness of breath or wheezing.  You develop a fever, rash, vomiting, fainting, or other serious symptoms. MAKE SURE YOU:  Understand these instructions.   Will watch your condition.   Will get help right away if you are not doing well or get worse.  Document Released: 10/16/2005 Document Revised: 06/18/2013 Document Reviewed: 03/30/2013 [facility] Patient Information 2015 [place], [place]. This information is not intended to replace advice given to you by your health care provider. Make sure you discuss any questions you have with your health care provider.

## 2015-03-08 ENCOUNTER — Other Ambulatory Visit: Payer: Self-pay | Admitting: Obstetrics and Gynecology

## 2015-03-08 DIAGNOSIS — O3680X1 Pregnancy with inconclusive fetal viability, fetus 1: Secondary | ICD-10-CM

## 2015-03-09 ENCOUNTER — Ambulatory Visit: Payer: Medicaid Other

## 2015-03-19 ENCOUNTER — Other Ambulatory Visit: Payer: Self-pay | Admitting: Obstetrics and Gynecology

## 2015-03-19 ENCOUNTER — Ambulatory Visit (INDEPENDENT_AMBULATORY_CARE_PROVIDER_SITE_OTHER): Payer: Medicaid Other

## 2015-03-19 DIAGNOSIS — O3680X1 Pregnancy with inconclusive fetal viability, fetus 1: Secondary | ICD-10-CM | POA: Diagnosis not present

## 2015-03-19 NOTE — Progress Notes (Signed)
[place] 17+6wks by us,edd 08/21/2015,ant pl,breech,normal ov's bilat,fht163bpm,efw 218g, pt will return for an anatomy scan in 2wks.

## 2015-03-31 ENCOUNTER — Other Ambulatory Visit: Payer: Self-pay | Admitting: Obstetrics & Gynecology

## 2015-03-31 DIAGNOSIS — I4891 Unspecified atrial fibrillation: Secondary | ICD-10-CM

## 2015-03-31 DIAGNOSIS — Z1389 Encounter for screening for other disorder: Secondary | ICD-10-CM

## 2015-03-31 DIAGNOSIS — O99322 Drug use complicating pregnancy, second trimester: Secondary | ICD-10-CM

## 2015-03-31 DIAGNOSIS — O99412 Diseases of the circulatory system complicating pregnancy, second trimester: Secondary | ICD-10-CM

## 2015-03-31 DIAGNOSIS — I341 Nonrheumatic mitral (valve) prolapse: Secondary | ICD-10-CM

## 2015-04-05 ENCOUNTER — Ambulatory Visit (INDEPENDENT_AMBULATORY_CARE_PROVIDER_SITE_OTHER): Payer: Medicaid Other

## 2015-04-05 DIAGNOSIS — I341 Nonrheumatic mitral (valve) prolapse: Secondary | ICD-10-CM

## 2015-04-05 DIAGNOSIS — I4891 Unspecified atrial fibrillation: Secondary | ICD-10-CM | POA: Diagnosis not present

## 2015-04-05 DIAGNOSIS — F1910 Other psychoactive substance abuse, uncomplicated: Secondary | ICD-10-CM | POA: Diagnosis not present

## 2015-04-05 DIAGNOSIS — Z363 Encounter for antenatal screening for malformations: Secondary | ICD-10-CM

## 2015-04-05 DIAGNOSIS — O99412 Diseases of the circulatory system complicating pregnancy, second trimester: Secondary | ICD-10-CM | POA: Diagnosis not present

## 2015-04-05 DIAGNOSIS — O99322 Drug use complicating pregnancy, second trimester: Secondary | ICD-10-CM

## 2015-04-05 DIAGNOSIS — O09892 Supervision of other high risk pregnancies, second trimester: Secondary | ICD-10-CM

## 2015-04-05 DIAGNOSIS — Z1389 Encounter for screening for other disorder: Secondary | ICD-10-CM

## 2015-04-05 NOTE — Progress Notes (Signed)
US 20+2wks,measurement c/w dates,normal ov's bilat,sdp of fluid 6.7cm,cx (tv)3.4cm,ant pl gr 1,cephalic,efw 340g,fht 153bpm,anatomy complete w/no obvious abn seen

## 2015-04-07 ENCOUNTER — Encounter: Payer: Medicaid Other | Admitting: Women's Health

## 2015-04-07 ENCOUNTER — Encounter: Payer: Self-pay | Admitting: Women's Health

## 2015-04-13 ENCOUNTER — Other Ambulatory Visit (HOSPITAL_COMMUNITY)
Admission: RE | Admit: 2015-04-13 | Discharge: 2015-04-13 | Disposition: A | Discharge status: Home / Self Care | Payer: Medicaid Other | Source: Ambulatory Visit | Attending: Advanced Practice Midwife | Admitting: Advanced Practice Midwife

## 2015-04-13 ENCOUNTER — Encounter: Payer: Self-pay | Admitting: Advanced Practice Midwife

## 2015-04-13 ENCOUNTER — Other Ambulatory Visit: Payer: Self-pay | Admitting: Advanced Practice Midwife

## 2015-04-13 ENCOUNTER — Ambulatory Visit (INDEPENDENT_AMBULATORY_CARE_PROVIDER_SITE_OTHER): Payer: Medicaid Other | Admitting: Advanced Practice Midwife

## 2015-04-13 VITALS — BP 110/52 | HR 104 | Wt 128.0 lb

## 2015-04-13 DIAGNOSIS — Z331 Pregnant state, incidental: Secondary | ICD-10-CM

## 2015-04-13 DIAGNOSIS — Z01419 Encounter for gynecological examination (general) (routine) without abnormal findings: Secondary | ICD-10-CM | POA: Insufficient documentation

## 2015-04-13 DIAGNOSIS — Z113 Encounter for screening for infections with a predominantly sexual mode of transmission: Secondary | ICD-10-CM | POA: Diagnosis present

## 2015-04-13 DIAGNOSIS — F1910 Other psychoactive substance abuse, uncomplicated: Secondary | ICD-10-CM | POA: Insufficient documentation

## 2015-04-13 DIAGNOSIS — Z3682 Encounter for antenatal screening for nuchal translucency: Secondary | ICD-10-CM

## 2015-04-13 DIAGNOSIS — Z0283 Encounter for blood-alcohol and blood-drug test: Secondary | ICD-10-CM

## 2015-04-13 DIAGNOSIS — O0932 Supervision of pregnancy with insufficient antenatal care, second trimester: Secondary | ICD-10-CM | POA: Insufficient documentation

## 2015-04-13 DIAGNOSIS — Z369 Encounter for antenatal screening, unspecified: Secondary | ICD-10-CM

## 2015-04-13 DIAGNOSIS — IMO0002 Reserved for concepts with insufficient information to code with codable children: Secondary | ICD-10-CM

## 2015-04-13 DIAGNOSIS — Z98891 History of uterine scar from previous surgery: Secondary | ICD-10-CM | POA: Insufficient documentation

## 2015-04-13 DIAGNOSIS — Z3492 Encounter for supervision of normal pregnancy, unspecified, second trimester: Secondary | ICD-10-CM | POA: Diagnosis not present

## 2015-04-13 DIAGNOSIS — Z124 Encounter for screening for malignant neoplasm of cervix: Secondary | ICD-10-CM

## 2015-04-13 DIAGNOSIS — Z3490 Encounter for supervision of normal pregnancy, unspecified, unspecified trimester: Secondary | ICD-10-CM | POA: Insufficient documentation

## 2015-04-13 DIAGNOSIS — Z1389 Encounter for screening for other disorder: Secondary | ICD-10-CM

## 2015-04-13 DIAGNOSIS — Z1151 Encounter for screening for human papillomavirus (HPV): Secondary | ICD-10-CM | POA: Insufficient documentation

## 2015-04-13 LAB — OB RESULTS CONSOLE HIV ANTIBODY (ROUTINE TESTING): HIV: NONREACTIVE

## 2015-04-13 LAB — OB RESULTS CONSOLE RUBELLA ANTIBODY, IGM: Rubella: IMMUNE
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
## 2015-04-14 ENCOUNTER — Encounter: Payer: Self-pay | Admitting: Women's Health

## 2015-04-14 ENCOUNTER — Ambulatory Visit (INDEPENDENT_AMBULATORY_CARE_PROVIDER_SITE_OTHER): Payer: Medicaid Other | Admitting: Women's Health

## 2015-04-14 ENCOUNTER — Telehealth: Payer: Self-pay | Admitting: *Deleted

## 2015-04-14 VITALS — BP 102/56 | HR 84 | Wt 128.0 lb

## 2015-04-14 DIAGNOSIS — G8929 Other chronic pain: Secondary | ICD-10-CM

## 2015-04-14 DIAGNOSIS — Z8679 Personal history of other diseases of the circulatory system: Secondary | ICD-10-CM

## 2015-04-14 DIAGNOSIS — Z331 Pregnant state, incidental: Secondary | ICD-10-CM

## 2015-04-14 DIAGNOSIS — R569 Unspecified convulsions: Secondary | ICD-10-CM

## 2015-04-14 DIAGNOSIS — J449 Chronic obstructive pulmonary disease, unspecified: Secondary | ICD-10-CM

## 2015-04-14 DIAGNOSIS — O09892 Supervision of other high risk pregnancies, second trimester: Secondary | ICD-10-CM

## 2015-04-14 DIAGNOSIS — Z98891 History of uterine scar from previous surgery: Secondary | ICD-10-CM

## 2015-04-14 DIAGNOSIS — O09219 Supervision of pregnancy with history of pre-term labor, unspecified trimester: Secondary | ICD-10-CM

## 2015-04-14 DIAGNOSIS — Z6791 Unspecified blood type, Rh negative: Secondary | ICD-10-CM | POA: Insufficient documentation

## 2015-04-14 DIAGNOSIS — Z3492 Encounter for supervision of normal pregnancy, unspecified, second trimester: Secondary | ICD-10-CM

## 2015-04-14 DIAGNOSIS — O26899 Other specified pregnancy related conditions, unspecified trimester: Secondary | ICD-10-CM

## 2015-04-14 DIAGNOSIS — O09899 Supervision of other high risk pregnancies, unspecified trimester: Secondary | ICD-10-CM | POA: Insufficient documentation

## 2015-04-14 DIAGNOSIS — J40 Bronchitis, not specified as acute or chronic: Secondary | ICD-10-CM

## 2015-04-14 DIAGNOSIS — I341 Nonrheumatic mitral (valve) prolapse: Secondary | ICD-10-CM

## 2015-04-14 DIAGNOSIS — O09212 Supervision of pregnancy with history of pre-term labor, second trimester: Secondary | ICD-10-CM

## 2015-04-14 DIAGNOSIS — J45909 Unspecified asthma, uncomplicated: Secondary | ICD-10-CM | POA: Insufficient documentation

## 2015-04-14 DIAGNOSIS — O360121 Maternal care for anti-D [Rh] antibodies, second trimester, fetus 1: Secondary | ICD-10-CM

## 2015-04-14 DIAGNOSIS — Z1389 Encounter for screening for other disorder: Secondary | ICD-10-CM

## 2015-04-14 DIAGNOSIS — F17200 Nicotine dependence, unspecified, uncomplicated: Secondary | ICD-10-CM | POA: Insufficient documentation

## 2015-04-14 DIAGNOSIS — J019 Acute sinusitis, unspecified: Secondary | ICD-10-CM

## 2015-04-14 LAB — POCT URINALYSIS DIPSTICK
GLUCOSE UA: NEGATIVE
Ketones, UA: NEGATIVE
Leukocytes, UA: NEGATIVE
NITRITE UA: NEGATIVE
Protein, UA: NEGATIVE
RBC UA: NEGATIVE

## 2015-04-14 LAB — URINE CULTURE: ORGANISM ID, BACTERIA: NO GROWTH

## 2015-04-14 MED ORDER — AZITHROMYCIN 250 MG PO TABS: ORAL_TABLET | ORAL | Status: DC

## 2015-04-14 MED ORDER — ALBUTEROL SULFATE HFA 108 (90 BASE) MCG/ACT IN AERS: 1.0000 | INHALATION_SPRAY | RESPIRATORY_TRACT | Status: DC | PRN

## 2015-04-14 MED ORDER — PREDNISONE 20 MG PO TABS: 40.0000 mg | ORAL_TABLET | Freq: Every day | ORAL | Status: DC

## 2015-04-14 NOTE — Patient Instructions (Signed)
For Headaches:   Stay well hydrated, drink enough water so that your urine is clear, sometimes if you are dehydrated you can get headaches  Eat small frequent meals and snacks, sometimes if you are hungry you can get headaches  Sometimes you get headaches during pregnancy from the pregnancy hormones  You can try tylenol (1-2 regular strength 325mg  or 1-2 extra strength 500mg ) as directed on the box. The least amount of medication that works is best.   Cool compresses (cool wet washcloth or ice pack) to area of head that is hurting  You can also try drinking a caffeinated drink to see if this will help  Call us if these things aren't helping your headaches   You have a viral infection that will resolve on its own over time.  Symptoms typically last 3-7 days but can stretch out to 2-3 weeks.  Unfortunately, antibiotics are not helpful for viral infections.  Humidifier and saline nasal spray for nasal congestion  Regular robitussin, cough drops for cough  Warm salt water gargles for sore throat  Mucinex with lots of water to help you cough up the mucous in your chest if needed  Drink plenty of fluids and stay hydrated!  Wash your hands frequently.  Call if you are not improving by 7-10 days.

## 2015-04-14 NOTE — Progress Notes (Signed)
[facility] Visit  Patient name: Leslie Christian MRN [id number]  Date of birth: Dec 17, 1975  CC & HPI:  Leslie Christian is a [id number] 39 y.o. at [redacted]w[redacted]d Caucasian female presenting today for report of sinus pressure, wheezing, sob, cough productive at first now not so much x 6 days. Had appt w/ PCP today who would not tx her d/t being pregnant. Has decreased smoking to 1/2ppd. Reports h/o copd and asthma, but doesn't have inhaler at home. Has been taking otc alkaseltzer severe cold & apap w/o relief. +FM. No vb, lof, uc's.   Pertinent History Reviewed:  Medical & Surgical Hx:   Past Medical History  Diagnosis Date  . Asthma   . COPD (chronic obstructive pulmonary disease)   . Atrial fibrillation   . Mitral valve prolapse   . Chronic back pain   . Chronic neck pain   . Migraine headache   . Anxiety   . Polysubstance abuse     Hx of cocaine, opiates, marijuana use  . Seizures    Past Surgical History  Procedure Laterality Date  . Cesarean section    . Endoscopy    . Cesarean section N/A 07/09/2013    Procedure: CESAREAN SECTION;  Surgeon: Reva Bores, MD;  Location: [facility];  Service: Obstetrics;  Laterality: N/A;  . Examination under anesthesia N/A 07/20/2013    Procedure: EXAM UNDER ANESTHESIA / Repair of traumatic dehisence of cesarean incision..;  Surgeon: Allie Bossier, MD;  Location: [facility];  Service: Gynecology;  Laterality: N/A;  . Repair traumatic dehisence of cesarean section     Medications: Reviewed & Updated - see associated section Social History: Reviewed -  reports that she has been smoking Cigarettes.  She has a 6 pack-year smoking history. She has never used smokeless tobacco.  Objective Findings:  Vitals: BP 102/56 mmHg  Pulse 84  Wt 128 lb (58.06 kg)  LMP 09/29/2014 (Approximate)  Physical Examination: General appearance - alert, oriented, no acute distress Head: +sinus pressure w/ palpation Throat: nonerythematous w/o exudate Chest - bilateral  inspiratory & expiratory wheezing in all fields Heart - normal rate and regular rhythm FHR: 142 via dopplers  Results for orders placed or performed in visit on 04/14/15 (from the past 24 hour(s))  POCT urinalysis dipstick   Collection Time: 04/14/15 11:28 AM  Result Value Ref Range   Color, UA     Clarity, UA     Glucose, UA neg    Bilirubin, UA     Ketones, UA neg    Spec Grav, UA     Blood, UA neg    pH, UA     Protein, UA neg    Urobilinogen, UA     Nitrite, UA neg    Leukocytes, UA Negative Negative  Results for orders placed or performed in visit on 04/13/15 (from the past 24 hour(s))  Varicella zoster antibody, IgG   Collection Time: 04/13/15  4:35 PM  Result Value Ref Range   VARICELLA ZOSTER IGG >4000 Immune >165 index  CBC   Collection Time: 04/13/15  4:35 PM  Result Value Ref Range   WBC 15.8 (H) 3.4 - 10.8 x10E3/uL   RBC 4.05 3.77 - 5.28 x10E6/uL   Hemoglobin 13.1 11.1 - 15.9 g/dL   Hematocrit 28.4 13.2 - 46.6 %   MCV 95 79 - 97 fL   MCH 32.3 26.6 - 33.0 pg   MCHC 34.0 31.5 - 35.7 g/dL   RDW 44.0  12.3 - 15.4 %   Platelets 292 150 - 379 x10E3/uL  HIV antibody   Collection Time: 04/13/15  4:35 PM  Result Value Ref Range   HIV Screen 4th Generation wRfx Non Reactive Non Reactive  Rubella screen   Collection Time: 04/13/15  4:35 PM  Result Value Ref Range   RUBELLA ANTIBODIES, IGG 4.99 Immune >0.99 index  RPR   Collection Time: 04/13/15  4:35 PM  Result Value Ref Range   RPR Ser Ql Non Reactive Non Reactive  Hepatitis B surface antigen   Collection Time: 04/13/15  4:35 PM  Result Value Ref Range   Hepatitis B Surface Ag Negative Negative  Pain Management Screening Profile (10S)   Collection Time: 04/13/15  4:35 PM  Result Value Ref Range   Amphetamine Screen, Ur Negative Cutoff=1000 ng/mL   Barbiturate Screen, Ur Negative Cutoff=200 ng/mL   Benzodiazepine Screen, Urine Positive Cutoff=200 ng/mL   Cannabinoids Ur Ql Scn Positive Cutoff=20 ng/mL    Cocaine(Metab.)Screen, Urine Negative Cutoff=300 ng/mL   Opiate Scrn, Ur Negative Cutoff=300 ng/mL   Oxycodone+Oxymorphone Ur Ql Scn Negative Cutoff=100 ng/mL   PCP Scrn, Ur Negative Cutoff=25 ng/mL   Methadone Scn, Ur Negative Cutoff=300 ng/mL   Propoxyphene, Screen Negative Cutoff=300 ng/mL   Creatinine(Crt), U 88.6 20.0 - 300.0 mg/dL   Ph of Urine 5.6 4.5 - 8.9  Urinalysis, Routine w reflex microscopic (not at [facility])   Collection Time: 04/13/15  4:35 PM  Result Value Ref Range   Specific Gravity, UA 1.016 1.005 - 1.030   pH, UA 6.0 5.0 - 7.5   Color, UA Yellow Yellow   Appearance Ur Clear Clear   Leukocytes, UA Negative Negative   Protein, UA Negative Negative/Trace   Glucose, UA Negative Negative   Ketones, UA Trace (A) Negative   RBC, UA Negative Negative   Bilirubin, UA Negative Negative   Urobilinogen, Ur 0.2 0.2 - 1.0 mg/dL   Nitrite, UA Negative Negative   Microscopic Examination Comment   Antibody screen   Collection Time: 04/13/15  4:35 PM  Result Value Ref Range   Antibody Screen Negative Negative  ABO/Rh   Collection Time: 04/13/15  4:35 PM  Result Value Ref Range   ABO Grouping A    Rh Factor Negative      Assessment & Plan:  A:   [redacted]w[redacted]d SIUP  Sinusitis  Bronchitis  H/O copd/asthma  Smoker P:  Rx z-pak, prednisone 40mg  daily x 10d, albuterol inhaler  To seek care if not improving/worsening   F/U Monday for f/u visit w/ MD to make sure improving and to discuss history (possible cardiology and neurology consult) and to begin 17P   Marge Duncans CNM, [facility] 04/14/2015 12:24 PM

## 2015-04-14 NOTE — Telephone Encounter (Signed)
Ferdie Ping, PA with [facility] called regarding [facility]. She came to the office today with terrible wheezing, deep shallow breaths. She is still smoking 1 and a half packs per day. She was advised to go to ER per Ethelene Browns. He said pt will need a chest xray. He wanted to make [place] aware incase she called our office. JSY

## 2015-04-15 LAB — URINE CULTURE: Organism ID, Bacteria: NO GROWTH

## 2015-04-15 LAB — CYTOLOGY - PAP

## 2015-04-19 ENCOUNTER — Encounter: Payer: Self-pay | Admitting: Obstetrics and Gynecology

## 2015-04-19 ENCOUNTER — Ambulatory Visit: Payer: Medicaid Other

## 2015-04-19 ENCOUNTER — Ambulatory Visit (INDEPENDENT_AMBULATORY_CARE_PROVIDER_SITE_OTHER): Payer: Medicaid Other | Admitting: Obstetrics and Gynecology

## 2015-04-19 VITALS — BP 100/50 | HR 80

## 2015-04-19 DIAGNOSIS — J418 Mixed simple and mucopurulent chronic bronchitis: Secondary | ICD-10-CM

## 2015-04-19 DIAGNOSIS — O09892 Supervision of other high risk pregnancies, second trimester: Secondary | ICD-10-CM

## 2015-04-19 DIAGNOSIS — O09212 Supervision of pregnancy with history of pre-term labor, second trimester: Secondary | ICD-10-CM

## 2015-04-19 DIAGNOSIS — Z331 Pregnant state, incidental: Secondary | ICD-10-CM

## 2015-04-19 DIAGNOSIS — Z1389 Encounter for screening for other disorder: Secondary | ICD-10-CM

## 2015-04-19 LAB — CBC
HEMATOCRIT: 38.5 % (ref 34.0–46.6)
Hemoglobin: 13.1 g/dL (ref 11.1–15.9)
MCH: 32.3 pg (ref 26.6–33.0)
MCHC: 34 g/dL (ref 31.5–35.7)
MCV: 95 fL (ref 79–97)
Platelets: 292 10*3/uL (ref 150–379)
RBC: 4.05 x10E6/uL (ref 3.77–5.28)
RDW: 14.1 % (ref 12.3–15.4)
WBC: 15.8 10*3/uL — ABNORMAL HIGH (ref 3.4–10.8)

## 2015-04-19 LAB — PMP SCREEN PROFILE (10S), URINE
Amphetamine Screen, Ur: NEGATIVE ng/mL
Barbiturate Screen, Ur: NEGATIVE ng/mL
Benzodiazepine Screen, Urine: POSITIVE ng/mL
Cannabinoids Ur Ql Scn: POSITIVE ng/mL
Cocaine(Metab.)Screen, Urine: NEGATIVE ng/mL
Creatinine(Crt), U: 88.6 mg/dL (ref 20.0–300.0)
Methadone Scn, Ur: NEGATIVE ng/mL
OPIATE SCRN UR: NEGATIVE ng/mL
Oxycodone+Oxymorphone Ur Ql Scn: NEGATIVE ng/mL
PCP SCRN UR: NEGATIVE ng/mL
Ph of Urine: 5.6 (ref 4.5–8.9)
Propoxyphene, Screen: NEGATIVE ng/mL

## 2015-04-19 LAB — POCT URINALYSIS DIPSTICK
Glucose, UA: NEGATIVE
KETONES UA: NEGATIVE
Leukocytes, UA: NEGATIVE
Nitrite, UA: NEGATIVE
PROTEIN UA: NEGATIVE
RBC UA: NEGATIVE

## 2015-04-19 LAB — AFP, QUAD SCREEN
DIA Mom Value: 2.43
DIA Value (EIA): 696.34 pg/mL
DSR (By Age)    1 IN: 184
DSR (Second Trimester) 1 IN: 95
GESTATIONAL AGE AFP: 21.4 wk
MSAFP Mom: 0.94
MSAFP: 73 ng/mL
MSHCG MOM: 1.14
MSHCG: 29016 m[IU]/mL
Maternal Age At EDD: 37.1 YEARS
Osb Risk: 10000
T18 (By Age): 1:717 {titer}
Test Results:: POSITIVE — AB
UE3 MOM: 1.17
Weight: 128 [lb_av]
uE3 Value: 2.9 ng/mL

## 2015-04-19 LAB — URINALYSIS, ROUTINE W REFLEX MICROSCOPIC
BILIRUBIN UA: NEGATIVE
Glucose, UA: NEGATIVE
Leukocytes, UA: NEGATIVE
Nitrite, UA: NEGATIVE
PH UA: 6 (ref 5.0–7.5)
PROTEIN UA: NEGATIVE
RBC, UA: NEGATIVE
Specific Gravity, UA: 1.016 (ref 1.005–1.030)
UUROB: 0.2 mg/dL (ref 0.2–1.0)

## 2015-04-19 LAB — ABO/RH: RH TYPE: NEGATIVE

## 2015-04-19 LAB — RPR: RPR Ser Ql: NONREACTIVE

## 2015-04-19 LAB — HIV ANTIBODY (ROUTINE TESTING W REFLEX): HIV SCREEN 4TH GENERATION: NONREACTIVE

## 2015-04-19 LAB — CYSTIC FIBROSIS MUTATION 97: Interpretation: NOT DETECTED

## 2015-04-19 LAB — HEPATITIS B SURFACE ANTIGEN: HEP B S AG: NEGATIVE

## 2015-04-19 LAB — VARICELLA ZOSTER ANTIBODY, IGG: Varicella zoster IgG: >4000 index (ref >165)

## 2015-04-19 LAB — ANTIBODY SCREEN: Antibody Screen: NEGATIVE

## 2015-04-19 LAB — RUBELLA SCREEN: Rubella Antibodies, IGG: 4.99 index (ref >0.99)

## 2015-04-19 MED ORDER — HYDROXYPROGESTERONE CAPROATE 250 MG/ML IM OIL
250.0000 mg | TOPICAL_OIL | Freq: Once | INTRAMUSCULAR | Status: AC
  Administered 2015-04-19: 250 mg via INTRAMUSCULAR

## 2015-04-19 MED ORDER — TRAMADOL HCL 50 MG PO TABS: 50.0000 mg | ORAL_TABLET | Freq: Four times a day (QID) | ORAL | Status: DC | PRN

## 2015-04-19 MED ORDER — DM-GUAIFENESIN ER 30-600 MG PO TB12: 1.0000 | ORAL_TABLET | Freq: Two times a day (BID) | ORAL | Status: DC

## 2015-04-19 MED ORDER — FLUTICASONE-SALMETEROL 250-50 MCG/DOSE IN AEPB: 1.0000 | INHALATION_SPRAY | Freq: Two times a day (BID) | RESPIRATORY_TRACT | Status: DC

## 2015-04-19 NOTE — Progress Notes (Signed)
Fetal Surveillance Testing today:  NONE,    High Risk Pregnancy Diagnosis(es):   Bronchitis, Asthma, copd  1 wk followup for pt's severe SOB and coughing In this 38 yr HROB with asthma, COPD from smoking (?and cocaine), also PTD x 2 ,   [id number] [redacted]w[redacted]d Estimated Date of Delivery: 08/21/15  Blood pressure 100/50, pulse 80, last menstrual period 09/29/2014, unknown if currently breastfeeding.  Urinalysis: Negative   HPI: The patient is being seen today for ongoing management of bronchitis, asthma, COPD Today she reports gradually improving SOB after she had a URI last week. She was treated by Selena Batten and was told to be evaluated by an MD for a follow-up. She finished a course of azithromax yesterday and is still taking prednisone. Patient reports a history of asthma, COPD, and Afib; she is currently using Albuterol at home. Patient is a 0.5 PPD smoker. She denies going on nicotine patches since her symptoms began. She denies fever.   Patient also complains of chronic back pain and severe headaches and migraines, for which she requests refil of her Tramadol which is used prn. She states she uses Tylenol as needed up to a certain point, but she states she can only take so much before her stomach begins to feel upset. Only then does she switch to using tramadol to effectively alleviate her pain.  BP weight and urine results all reviewed and noted. Patient reports good fetal movement, denies any bleeding and no rupture of membranes symptoms or regular contractions.  Patient is without complaints other than noted in her HPI. All questions were answered.  Physical Exam  Pulmonary: Inspiratory and expiratory rales, with late expiratory sonorous rhonchi  Assessment:  1.  Pregnancy at [redacted]w[redacted]d,  Estimated Date of Delivery: 08/21/15                         2.  Bronchitis with history of COPD and asthma.    3. Chronic pain, treated with Tylenol and Tramadol prn  Medication(s) Plans:  Will Rx Tramadol prn,  Advair, guaifinesin  Treatment Plan:  As noted above.  Follow up in 1 weeks for appointment for high risk OB care, followup of bronchitis   This chart was partially SCRIBED for Dr. Emelda Fear by Ronney Lion, Medical Scribe. The patient was seen in room 1, and her care started at 12:04 PM.   I personally performed the services described in this documentation, which was SCRIBED in my presence. The recorded information has been reviewed and considered accurate. It has been edited as necessary during review. Tilda Burrow, MD

## 2015-04-19 NOTE — Progress Notes (Signed)
Patient ID: Leslie Christian, female   DOB: 05-11-1976, 39 y.o.   MRN: [id number]  See note by jvf above

## 2015-04-19 NOTE — Progress Notes (Signed)
Pt denies any problems or concerns at this time.  

## 2015-04-26 ENCOUNTER — Encounter: Payer: Self-pay | Admitting: Obstetrics and Gynecology

## 2015-04-26 ENCOUNTER — Ambulatory Visit (INDEPENDENT_AMBULATORY_CARE_PROVIDER_SITE_OTHER): Payer: Medicaid Other | Admitting: Obstetrics and Gynecology

## 2015-04-26 VITALS — BP 110/60 | HR 72 | Wt 125.0 lb

## 2015-04-26 DIAGNOSIS — F419 Anxiety disorder, unspecified: Secondary | ICD-10-CM

## 2015-04-26 DIAGNOSIS — O09212 Supervision of pregnancy with history of pre-term labor, second trimester: Secondary | ICD-10-CM | POA: Diagnosis not present

## 2015-04-26 DIAGNOSIS — Z1389 Encounter for screening for other disorder: Secondary | ICD-10-CM

## 2015-04-26 DIAGNOSIS — O09892 Supervision of other high risk pregnancies, second trimester: Secondary | ICD-10-CM

## 2015-04-26 DIAGNOSIS — Z331 Pregnant state, incidental: Secondary | ICD-10-CM

## 2015-04-26 DIAGNOSIS — F329 Major depressive disorder, single episode, unspecified: Secondary | ICD-10-CM

## 2015-04-26 LAB — POCT URINALYSIS DIPSTICK
Blood, UA: NEGATIVE
Blood, UA: NEGATIVE
Glucose, UA: NEGATIVE
Glucose, UA: NEGATIVE
KETONES UA: NEGATIVE
KETONES UA: NEGATIVE
LEUKOCYTES UA: NEGATIVE
LEUKOCYTES UA: NEGATIVE
Nitrite, UA: NEGATIVE
Nitrite, UA: NEGATIVE
PROTEIN UA: NEGATIVE
Protein, UA: NEGATIVE

## 2015-04-26 MED ORDER — ESCITALOPRAM OXALATE 10 MG PO TABS: 10.0000 mg | ORAL_TABLET | Freq: Every day | ORAL | Status: DC

## 2015-04-26 MED ORDER — HYDROXYPROGESTERONE CAPROATE 250 MG/ML IM OIL
250.0000 mg | TOPICAL_OIL | Freq: Once | INTRAMUSCULAR | Status: AC
  Administered 2015-04-26: 250 mg via INTRAMUSCULAR

## 2015-04-26 NOTE — Progress Notes (Signed)
Patient ID: Leslie HookHolly L Christian, female   DOB: 11/10/1975, 39 y.o.   MRN: [id number]  Fetal Surveillance Testing today:  NONE   High Risk Pregnancy Diagnosis(es):  UDS +THC/Benzo, PTD x 2 asthma, COPD from smoking (?and cocaine)  [id number] 5468w2d Estimated Date of Delivery: 08/21/15  Blood pressure 110/60, pulse 72, weight 125 lb (56.7 kg), last menstrual period 09/29/2014, unknown if currently breastfeeding.  Urinalysis: Negative   HPI: The patient is being seen today for ongoing management of HROB. Today she reports her severe coughing has improved significantly since being prescribed Advair last week and finishing Rx steroids. She states the Mucinex has also been an effective expectorant. Patient states she is still smoking during her pregnancy, but she has decreased her smoking to 1 PPD.   Patient reports she has stopped THC use since becoming pregnany. She states she was taking Valium for anxiety, but she has since been unable to refill it since becoming pregnant pt allegedly out of Rx now.will not refil. She states her anxiety has been very high since stopping her medication. Patient reports baseline poor sleep quality, but she has adjusted to it somewhat.  Patient last had an anatomy scan in June.   She states she would like this to be her last child.  Patient states she has been using mostly Tylenol and rarely Tramadol for chronic pain.   BP weight and urine results all reviewed and noted. Patient reports good fetal movement, denies any bleeding and no rupture of membranes symptoms or regular contractions.  Fundal Height:  23 cm Fetal Heart rate:  166 bpm Edema:  N/a  Physical Exam  Pulmonary: Inspiratory and expiratory rhonchi bilaterally. No air trapping. Normal I/E ratio.    Patient is without complaints other than noted in her HPI. All questions were answered.  All lab and sonogram results have been reviewed. Comments: not done   Assessment:  1.  Pregnancy at 4268w2d,  Estimated  Date of Delivery: 08/21/15                         2.  Resolved bronchitis sx.                        3.  Contraceptive management discussed.    4. TOLAC discussed, particularly following hx of 2 c/s.   Medication(s) Plans:  n/a  Treatment Plan:  Regular monitoring for pregnancy                          Prior c/s x 2 with nonrecurring indications. VBAC candidate needs consenting                          Desires sterilization. Sign consent.  Follow up q2 wk for appointment for high risk OB care, for u/s serial growth in 4 wk..    This chart was SCRIBED for Christin BachJohn Jeany Seville, MD by Ronney LionSuzanne Le, ED Scribe. This patient was seen in room 2 and the patient's care was started at 10:27 AM.  I personally performed the services described in this documentation, which was SCRIBED in my presence. The recorded information has been reviewed and considered accurate. It has been edited as necessary during review. Tilda BurrowFERGUSON,Taniaya Rudder V, MD

## 2015-04-26 NOTE — Progress Notes (Signed)
Pt denies any problems or concerns at this time.  

## 2015-05-04 ENCOUNTER — Ambulatory Visit (INDEPENDENT_AMBULATORY_CARE_PROVIDER_SITE_OTHER): Payer: Medicaid Other | Admitting: *Deleted

## 2015-05-04 ENCOUNTER — Encounter: Payer: Self-pay | Admitting: *Deleted

## 2015-05-04 VITALS — BP 90/54 | HR 68 | Ht 64.0 in | Wt 122.5 lb

## 2015-05-04 DIAGNOSIS — O09212 Supervision of pregnancy with history of pre-term labor, second trimester: Secondary | ICD-10-CM

## 2015-05-04 DIAGNOSIS — O0932 Supervision of pregnancy with insufficient antenatal care, second trimester: Secondary | ICD-10-CM

## 2015-05-04 DIAGNOSIS — Z1389 Encounter for screening for other disorder: Secondary | ICD-10-CM

## 2015-05-04 DIAGNOSIS — O09892 Supervision of other high risk pregnancies, second trimester: Secondary | ICD-10-CM

## 2015-05-04 DIAGNOSIS — Z98891 History of uterine scar from previous surgery: Secondary | ICD-10-CM

## 2015-05-04 DIAGNOSIS — Z331 Pregnant state, incidental: Secondary | ICD-10-CM

## 2015-05-04 DIAGNOSIS — Z3492 Encounter for supervision of normal pregnancy, unspecified, second trimester: Secondary | ICD-10-CM

## 2015-05-04 LAB — POCT URINALYSIS DIPSTICK
Blood, UA: NEGATIVE
Glucose, UA: NEGATIVE
Ketones, UA: NEGATIVE
Nitrite, UA: NEGATIVE
Protein, UA: NEGATIVE

## 2015-05-04 MED ORDER — HYDROXYPROGESTERONE CAPROATE 250 MG/ML IM OIL
250.0000 mg | TOPICAL_OIL | Freq: Once | INTRAMUSCULAR | Status: AC
  Administered 2015-05-04: 250 mg via INTRAMUSCULAR

## 2015-05-04 NOTE — Progress Notes (Signed)
Pt here for 17P. Reports no problems at this time. Return in 1 week for next shot. JSY 

## 2015-05-10 ENCOUNTER — Encounter: Payer: Self-pay | Admitting: *Deleted

## 2015-05-10 ENCOUNTER — Ambulatory Visit (INDEPENDENT_AMBULATORY_CARE_PROVIDER_SITE_OTHER): Payer: Medicaid Other | Admitting: *Deleted

## 2015-05-10 VITALS — BP 92/42 | HR 72 | Ht 64.0 in | Wt 123.0 lb

## 2015-05-10 DIAGNOSIS — O09892 Supervision of other high risk pregnancies, second trimester: Secondary | ICD-10-CM

## 2015-05-10 DIAGNOSIS — Z3492 Encounter for supervision of normal pregnancy, unspecified, second trimester: Secondary | ICD-10-CM

## 2015-05-10 DIAGNOSIS — O09212 Supervision of pregnancy with history of pre-term labor, second trimester: Secondary | ICD-10-CM

## 2015-05-10 DIAGNOSIS — Z331 Pregnant state, incidental: Secondary | ICD-10-CM

## 2015-05-10 DIAGNOSIS — Z98891 History of uterine scar from previous surgery: Secondary | ICD-10-CM

## 2015-05-10 DIAGNOSIS — O0932 Supervision of pregnancy with insufficient antenatal care, second trimester: Secondary | ICD-10-CM

## 2015-05-10 DIAGNOSIS — Z1389 Encounter for screening for other disorder: Secondary | ICD-10-CM

## 2015-05-10 LAB — POCT URINALYSIS DIPSTICK
Glucose, UA: NEGATIVE
Ketones, UA: NEGATIVE
Nitrite, UA: NEGATIVE
Protein, UA: NEGATIVE
RBC UA: NEGATIVE

## 2015-05-10 MED ORDER — HYDROXYPROGESTERONE CAPROATE 250 MG/ML IM OIL
250.0000 mg | TOPICAL_OIL | Freq: Once | INTRAMUSCULAR | Status: AC
  Administered 2015-05-10: 250 mg via INTRAMUSCULAR

## 2015-05-10 NOTE — Progress Notes (Signed)
Pt here for 17P. Reports no problems at this time. Return in 1 week for next shot. JSY 

## 2015-05-11 ENCOUNTER — Encounter: Payer: Medicaid Other | Admitting: Obstetrics & Gynecology

## 2015-05-17 ENCOUNTER — Other Ambulatory Visit: Payer: Self-pay | Admitting: Obstetrics and Gynecology

## 2015-05-17 ENCOUNTER — Encounter: Payer: Self-pay | Admitting: *Deleted

## 2015-05-17 ENCOUNTER — Ambulatory Visit (INDEPENDENT_AMBULATORY_CARE_PROVIDER_SITE_OTHER): Payer: Medicaid Other | Admitting: *Deleted

## 2015-05-17 VITALS — BP 110/60 | HR 80 | Ht 64.0 in | Wt 124.5 lb

## 2015-05-17 DIAGNOSIS — O09212 Supervision of pregnancy with history of pre-term labor, second trimester: Secondary | ICD-10-CM

## 2015-05-17 DIAGNOSIS — I519 Heart disease, unspecified: Secondary | ICD-10-CM

## 2015-05-17 DIAGNOSIS — Z1389 Encounter for screening for other disorder: Secondary | ICD-10-CM

## 2015-05-17 DIAGNOSIS — Z331 Pregnant state, incidental: Secondary | ICD-10-CM

## 2015-05-17 DIAGNOSIS — Z8751 Personal history of pre-term labor: Secondary | ICD-10-CM

## 2015-05-17 DIAGNOSIS — O99322 Drug use complicating pregnancy, second trimester: Secondary | ICD-10-CM

## 2015-05-17 DIAGNOSIS — Z98891 History of uterine scar from previous surgery: Secondary | ICD-10-CM

## 2015-05-17 DIAGNOSIS — O99419 Diseases of the circulatory system complicating pregnancy, unspecified trimester: Secondary | ICD-10-CM

## 2015-05-17 DIAGNOSIS — O0932 Supervision of pregnancy with insufficient antenatal care, second trimester: Secondary | ICD-10-CM

## 2015-05-17 DIAGNOSIS — Z3492 Encounter for supervision of normal pregnancy, unspecified, second trimester: Secondary | ICD-10-CM

## 2015-05-17 LAB — POCT URINALYSIS DIPSTICK
Blood, UA: NEGATIVE
Glucose, UA: NEGATIVE
Ketones, UA: NEGATIVE
LEUKOCYTES UA: NEGATIVE
Nitrite, UA: NEGATIVE
PROTEIN UA: NEGATIVE

## 2015-05-17 MED ORDER — HYDROXYPROGESTERONE CAPROATE 250 MG/ML IM OIL
250.0000 mg | TOPICAL_OIL | Freq: Once | INTRAMUSCULAR | Status: AC
  Administered 2015-05-17: 250 mg via INTRAMUSCULAR

## 2015-05-17 NOTE — Progress Notes (Signed)
Pt here for 17P. Reports no problems at this time. Return in 1 week for next shot. JSY 

## 2015-05-18 ENCOUNTER — Other Ambulatory Visit: Payer: Self-pay | Admitting: Obstetrics and Gynecology

## 2015-05-18 DIAGNOSIS — R109 Unspecified abdominal pain: Principal | ICD-10-CM

## 2015-05-18 DIAGNOSIS — O26899 Other specified pregnancy related conditions, unspecified trimester: Secondary | ICD-10-CM

## 2015-05-18 MED ORDER — TRAMADOL HCL 50 MG PO TABS: 50.0000 mg | ORAL_TABLET | Freq: Four times a day (QID) | ORAL | Status: DC | PRN

## 2015-05-18 NOTE — Telephone Encounter (Signed)
rx refil at front desk

## 2015-05-19 NOTE — Telephone Encounter (Signed)
Pt states she missed a call from someone here.  I informed pt that there was a RX refill ready for her at the front desk.  Pt verbalized understanding.

## 2015-05-24 ENCOUNTER — Other Ambulatory Visit: Payer: Self-pay | Admitting: Obstetrics and Gynecology

## 2015-05-24 ENCOUNTER — Ambulatory Visit (INDEPENDENT_AMBULATORY_CARE_PROVIDER_SITE_OTHER): Payer: Medicaid Other

## 2015-05-24 ENCOUNTER — Encounter: Payer: Self-pay | Admitting: Obstetrics and Gynecology

## 2015-05-24 ENCOUNTER — Ambulatory Visit (INDEPENDENT_AMBULATORY_CARE_PROVIDER_SITE_OTHER): Payer: Medicaid Other | Admitting: Obstetrics and Gynecology

## 2015-05-24 VITALS — BP 100/60 | HR 68 | Wt 124.0 lb

## 2015-05-24 DIAGNOSIS — I519 Heart disease, unspecified: Secondary | ICD-10-CM

## 2015-05-24 DIAGNOSIS — O99419 Diseases of the circulatory system complicating pregnancy, unspecified trimester: Secondary | ICD-10-CM

## 2015-05-24 DIAGNOSIS — O0932 Supervision of pregnancy with insufficient antenatal care, second trimester: Secondary | ICD-10-CM

## 2015-05-24 DIAGNOSIS — Z331 Pregnant state, incidental: Secondary | ICD-10-CM

## 2015-05-24 DIAGNOSIS — Z1389 Encounter for screening for other disorder: Secondary | ICD-10-CM

## 2015-05-24 DIAGNOSIS — O99322 Drug use complicating pregnancy, second trimester: Secondary | ICD-10-CM

## 2015-05-24 DIAGNOSIS — Z3492 Encounter for supervision of normal pregnancy, unspecified, second trimester: Secondary | ICD-10-CM

## 2015-05-24 DIAGNOSIS — F1920 Other psychoactive substance dependence, uncomplicated: Secondary | ICD-10-CM

## 2015-05-24 DIAGNOSIS — Z98891 History of uterine scar from previous surgery: Secondary | ICD-10-CM

## 2015-05-24 DIAGNOSIS — O09892 Supervision of other high risk pregnancies, second trimester: Secondary | ICD-10-CM

## 2015-05-24 DIAGNOSIS — O09212 Supervision of pregnancy with history of pre-term labor, second trimester: Secondary | ICD-10-CM

## 2015-05-24 MED ORDER — HYDROXYPROGESTERONE CAPROATE 250 MG/ML IM OIL
250.0000 mg | TOPICAL_OIL | Freq: Once | INTRAMUSCULAR | Status: AC
  Administered 2015-05-24: 250 mg via INTRAMUSCULAR

## 2015-05-24 NOTE — Progress Notes (Signed)
Patient ID: Leslie Christian, female   DOB: 13-Jan-1976, 39 y.o.   MRN: [id number]  [id number] [redacted]w[redacted]d Estimated Date of Delivery: 08/21/15  Blood pressure 100/60, pulse 68, weight 124 lb (56.246 kg), last menstrual period 09/29/2014, unknown if currently breastfeeding.   refer to the ob flow sheet for FH and FHR, also BP, Wt, Urine results:notable for negative  Patient reports  + good fetal movement, denies any bleeding and no rupture of membranes symptoms or regular contractions. Patient complaints: None at this time. Pt has 2 prior cesarean sections due to breech births, but wants to try vaginal delivery.  FH-28 cm FHR-136 bpm  Questions were answered. Assessment: [redacted]w[redacted]d; [id number], prior C/S x2 for nonrecurring indications with prior successful VBAC, desires TOLAC. Consents signed  Plan:  Continued routine obstetrical care.  F/u in 4 weeks for routine pre-natal care   This chart was scribed for Tilda Burrow, MD by Gwenyth Ober, ED Scribe. This patient was seen in room 2 and the patient's care was started at 10:52 AM.   . I personally performed the services described in this documentation, which was SCRIBED in my presence. The recorded information has been reviewed and considered accurate. It has been edited as necessary during review. Tilda Burrow, MD

## 2015-05-24 NOTE — Progress Notes (Signed)
[place] 27+3wks measurements c/w dates,EFW 1091g 55%,ant pl gr 1,cephalic, bilat adnexa wnl,afi 16.1cm,fht 138,cx appears closed

## 2015-05-24 NOTE — Progress Notes (Signed)
Pt denies any problems or concerns at this time.  

## 2015-05-31 ENCOUNTER — Ambulatory Visit: Payer: Medicaid Other | Admitting: Obstetrics and Gynecology

## 2015-05-31 ENCOUNTER — Encounter: Payer: Self-pay | Admitting: *Deleted

## 2015-05-31 ENCOUNTER — Encounter: Payer: Medicaid Other | Admitting: Obstetrics and Gynecology

## 2015-05-31 ENCOUNTER — Ambulatory Visit (INDEPENDENT_AMBULATORY_CARE_PROVIDER_SITE_OTHER): Payer: Self-pay | Admitting: *Deleted

## 2015-05-31 VITALS — BP 104/68 | HR 68 | Wt 126.5 lb

## 2015-05-31 DIAGNOSIS — O09893 Supervision of other high risk pregnancies, third trimester: Secondary | ICD-10-CM

## 2015-05-31 DIAGNOSIS — Z1389 Encounter for screening for other disorder: Secondary | ICD-10-CM

## 2015-05-31 DIAGNOSIS — Z8751 Personal history of pre-term labor: Secondary | ICD-10-CM

## 2015-05-31 DIAGNOSIS — Z331 Pregnant state, incidental: Secondary | ICD-10-CM

## 2015-05-31 DIAGNOSIS — O09213 Supervision of pregnancy with history of pre-term labor, third trimester: Secondary | ICD-10-CM | POA: Diagnosis not present

## 2015-05-31 LAB — POCT URINALYSIS DIPSTICK
GLUCOSE UA: NEGATIVE
KETONES UA: NEGATIVE
Leukocytes, UA: NEGATIVE
Nitrite, UA: NEGATIVE
Protein, UA: NEGATIVE
RBC UA: NEGATIVE

## 2015-05-31 MED ORDER — HYDROXYPROGESTERONE CAPROATE 250 MG/ML IM OIL
250.0000 mg | TOPICAL_OIL | Freq: Once | INTRAMUSCULAR | Status: AC
  Administered 2015-05-31: 250 mg via INTRAMUSCULAR

## 2015-06-07 ENCOUNTER — Encounter: Payer: Self-pay | Admitting: *Deleted

## 2015-06-07 ENCOUNTER — Ambulatory Visit (INDEPENDENT_AMBULATORY_CARE_PROVIDER_SITE_OTHER): Payer: Medicaid Other | Admitting: *Deleted

## 2015-06-07 VITALS — BP 100/60 | HR 72 | Ht 64.0 in | Wt 126.0 lb

## 2015-06-07 DIAGNOSIS — Z3492 Encounter for supervision of normal pregnancy, unspecified, second trimester: Secondary | ICD-10-CM

## 2015-06-07 DIAGNOSIS — O09893 Supervision of other high risk pregnancies, third trimester: Secondary | ICD-10-CM

## 2015-06-07 DIAGNOSIS — O09213 Supervision of pregnancy with history of pre-term labor, third trimester: Secondary | ICD-10-CM

## 2015-06-07 DIAGNOSIS — O0932 Supervision of pregnancy with insufficient antenatal care, second trimester: Secondary | ICD-10-CM

## 2015-06-07 DIAGNOSIS — Z98891 History of uterine scar from previous surgery: Secondary | ICD-10-CM

## 2015-06-07 MED ORDER — HYDROXYPROGESTERONE CAPROATE 250 MG/ML IM OIL
250.0000 mg | TOPICAL_OIL | Freq: Once | INTRAMUSCULAR | Status: AC
  Administered 2015-06-07: 250 mg via INTRAMUSCULAR

## 2015-06-07 NOTE — Progress Notes (Signed)
Pt here for 17P. Reports no problems at this time. Return in 1 week for next shot. JSY 

## 2015-06-14 ENCOUNTER — Encounter: Payer: Self-pay | Admitting: *Deleted

## 2015-06-14 ENCOUNTER — Ambulatory Visit (INDEPENDENT_AMBULATORY_CARE_PROVIDER_SITE_OTHER): Payer: Medicaid Other | Admitting: *Deleted

## 2015-06-14 VITALS — BP 102/68 | HR 64 | Ht 64.0 in | Wt 125.5 lb

## 2015-06-14 DIAGNOSIS — O0932 Supervision of pregnancy with insufficient antenatal care, second trimester: Secondary | ICD-10-CM

## 2015-06-14 DIAGNOSIS — O09893 Supervision of other high risk pregnancies, third trimester: Secondary | ICD-10-CM

## 2015-06-14 DIAGNOSIS — Z1389 Encounter for screening for other disorder: Secondary | ICD-10-CM

## 2015-06-14 DIAGNOSIS — Z3492 Encounter for supervision of normal pregnancy, unspecified, second trimester: Secondary | ICD-10-CM

## 2015-06-14 DIAGNOSIS — O09213 Supervision of pregnancy with history of pre-term labor, third trimester: Secondary | ICD-10-CM

## 2015-06-14 DIAGNOSIS — O360131 Maternal care for anti-D [Rh] antibodies, third trimester, fetus 1: Secondary | ICD-10-CM

## 2015-06-14 DIAGNOSIS — Z98891 History of uterine scar from previous surgery: Secondary | ICD-10-CM

## 2015-06-14 DIAGNOSIS — Z331 Pregnant state, incidental: Secondary | ICD-10-CM

## 2015-06-14 LAB — POCT URINALYSIS DIPSTICK
Blood, UA: NEGATIVE
Glucose, UA: NEGATIVE
KETONES UA: NEGATIVE
LEUKOCYTES UA: NEGATIVE
Nitrite, UA: NEGATIVE
PROTEIN UA: NEGATIVE

## 2015-06-14 MED ORDER — HYDROXYPROGESTERONE CAPROATE 250 MG/ML IM OIL
250.0000 mg | TOPICAL_OIL | Freq: Once | INTRAMUSCULAR | Status: AC
  Administered 2015-06-14: 250 mg via INTRAMUSCULAR

## 2015-06-14 MED ORDER — RHO D IMMUNE GLOBULIN 1500 UNIT/2ML IJ SOSY
300.0000 ug | PREFILLED_SYRINGE | Freq: Once | INTRAMUSCULAR | Status: AC
  Administered 2015-06-14: 300 ug via INTRAMUSCULAR

## 2015-06-14 NOTE — Progress Notes (Signed)
Pt here for 17P and Rhogam injection. Pt reports having some pelvic pressure, but good baby movement. Pt states she will keep a check on it and let us know if it gets worse. Return in 1 week for 17P. JSY

## 2015-06-21 ENCOUNTER — Encounter: Payer: Self-pay | Admitting: *Deleted

## 2015-06-21 ENCOUNTER — Ambulatory Visit (INDEPENDENT_AMBULATORY_CARE_PROVIDER_SITE_OTHER): Payer: Medicaid Other | Admitting: *Deleted

## 2015-06-21 ENCOUNTER — Ambulatory Visit (INDEPENDENT_AMBULATORY_CARE_PROVIDER_SITE_OTHER): Payer: Medicaid Other | Admitting: Obstetrics and Gynecology

## 2015-06-21 VITALS — BP 98/60 | HR 64 | Wt 126.0 lb

## 2015-06-21 VITALS — BP 98/60 | HR 64 | Ht 64.0 in | Wt 126.0 lb

## 2015-06-21 DIAGNOSIS — O09213 Supervision of pregnancy with history of pre-term labor, third trimester: Secondary | ICD-10-CM

## 2015-06-21 DIAGNOSIS — O09893 Supervision of other high risk pregnancies, third trimester: Secondary | ICD-10-CM

## 2015-06-21 DIAGNOSIS — Z98891 History of uterine scar from previous surgery: Secondary | ICD-10-CM

## 2015-06-21 DIAGNOSIS — O34219 Maternal care for unspecified type scar from previous cesarean delivery: Secondary | ICD-10-CM

## 2015-06-21 DIAGNOSIS — Z3493 Encounter for supervision of normal pregnancy, unspecified, third trimester: Secondary | ICD-10-CM

## 2015-06-21 DIAGNOSIS — Z331 Pregnant state, incidental: Secondary | ICD-10-CM

## 2015-06-21 DIAGNOSIS — O0932 Supervision of pregnancy with insufficient antenatal care, second trimester: Secondary | ICD-10-CM

## 2015-06-21 DIAGNOSIS — Z1389 Encounter for screening for other disorder: Secondary | ICD-10-CM

## 2015-06-21 DIAGNOSIS — Z3492 Encounter for supervision of normal pregnancy, unspecified, second trimester: Secondary | ICD-10-CM

## 2015-06-21 DIAGNOSIS — O3421 Maternal care for scar from previous cesarean delivery: Secondary | ICD-10-CM

## 2015-06-21 LAB — POCT URINALYSIS DIPSTICK
Glucose, UA: NEGATIVE
KETONES UA: NEGATIVE
Leukocytes, UA: NEGATIVE
Nitrite, UA: NEGATIVE
PROTEIN UA: NEGATIVE
RBC UA: NEGATIVE

## 2015-06-21 MED ORDER — HYDROXYPROGESTERONE CAPROATE 250 MG/ML IM OIL
250.0000 mg | TOPICAL_OIL | Freq: Once | INTRAMUSCULAR | Status: AC
  Administered 2015-06-21: 250 mg via INTRAMUSCULAR

## 2015-06-21 MED ORDER — FLUTICASONE-SALMETEROL 250-50 MCG/DOSE IN AEPB: INHALATION_SPRAY | RESPIRATORY_TRACT | Status: DC

## 2015-06-21 MED ORDER — TRAMADOL HCL 50 MG PO TABS: 50.0000 mg | ORAL_TABLET | Freq: Four times a day (QID) | ORAL | Status: DC | PRN

## 2015-06-21 NOTE — Progress Notes (Signed)
Pt denies any problems or concerns at this time.  

## 2015-06-21 NOTE — Progress Notes (Signed)
Patient ID: Leslie Christian, female   DOB: [birth year], 39 y.o.   MRN: [id number]  [id number] [redacted]w[redacted]d Estimated Date of Delivery: 08/21/15  Blood pressure 98/60, pulse 64, weight 126 lb (57.153 kg), last menstrual period 09/29/2014, unknown if currently breastfeeding.   refer to the ob flow sheet for FH and FHR, also BP, Wt, Urine results:notable for negative  Patient reports  + good fetal movement, denies any bleeding and no rupture of membranes symptoms or regular contractions. Patient complaints:None. Patient states she takes Tramadol when Tylenol doesn't work. She receives 30 Tramadol a month. Patient has reduced smoking to 5-6 cigarettes a day.   FHR: 129 bpm FH: 32 cm U/S shows Vertex presentation  Questions were answered. Assessment: Pregnancy [redacted]w[redacted]d, LROB hx PTB , on 17-P Plan:  Continued routine obstetrical care,  17P til 36+wk,             Refil tramadol, refil advair F/u in 2 weeks for pnx care   This chart was SCRIBED for Christin Bach, MD by Ronney Lion, ED Scribe. This patient was seen in room 2, and the patient's care was started at 11:21 AM.  I personally performed the services described in this documentation, which was SCRIBED in my presence. The recorded information has been reviewed and considered accurate. It has been edited as necessary during review. Tilda Burrow, MD

## 2015-06-21 NOTE — Progress Notes (Signed)
Pt here for 17P. Reports no problems at this time. Return in 1 week for next shot. JSY 

## 2015-06-28 ENCOUNTER — Ambulatory Visit (INDEPENDENT_AMBULATORY_CARE_PROVIDER_SITE_OTHER): Payer: Medicaid Other | Admitting: *Deleted

## 2015-06-28 ENCOUNTER — Encounter: Payer: Self-pay | Admitting: *Deleted

## 2015-06-28 VITALS — BP 100/60 | HR 64 | Ht 64.0 in | Wt 126.0 lb

## 2015-06-28 DIAGNOSIS — Z331 Pregnant state, incidental: Secondary | ICD-10-CM

## 2015-06-28 DIAGNOSIS — O0932 Supervision of pregnancy with insufficient antenatal care, second trimester: Secondary | ICD-10-CM

## 2015-06-28 DIAGNOSIS — O09213 Supervision of pregnancy with history of pre-term labor, third trimester: Secondary | ICD-10-CM

## 2015-06-28 DIAGNOSIS — Z3492 Encounter for supervision of normal pregnancy, unspecified, second trimester: Secondary | ICD-10-CM

## 2015-06-28 DIAGNOSIS — Z1389 Encounter for screening for other disorder: Secondary | ICD-10-CM

## 2015-06-28 DIAGNOSIS — Z98891 History of uterine scar from previous surgery: Secondary | ICD-10-CM

## 2015-06-28 DIAGNOSIS — O09893 Supervision of other high risk pregnancies, third trimester: Secondary | ICD-10-CM

## 2015-06-28 LAB — POCT URINALYSIS DIPSTICK
Blood, UA: NEGATIVE
GLUCOSE UA: NEGATIVE
KETONES UA: NEGATIVE
LEUKOCYTES UA: NEGATIVE
NITRITE UA: NEGATIVE
Protein, UA: NEGATIVE

## 2015-06-28 MED ORDER — HYDROXYPROGESTERONE CAPROATE 250 MG/ML IM OIL
250.0000 mg | TOPICAL_OIL | Freq: Once | INTRAMUSCULAR | Status: AC
  Administered 2015-06-28: 250 mg via INTRAMUSCULAR

## 2015-06-28 NOTE — Progress Notes (Signed)
Pt here for 17P. Reports having pressure. No bleeding or mucus. Baby active. Has a scheduled appt next week. Advised to keep that appt and if anything changed, let us know. Pt voiced understanding. Return in 1 week for next shot. JSY

## 2015-07-07 ENCOUNTER — Encounter: Payer: Self-pay | Admitting: *Deleted

## 2015-07-07 ENCOUNTER — Encounter: Payer: Medicaid Other | Admitting: Obstetrics and Gynecology

## 2015-07-14 ENCOUNTER — Encounter: Payer: Self-pay | Admitting: Advanced Practice Midwife

## 2015-07-14 ENCOUNTER — Ambulatory Visit (INDEPENDENT_AMBULATORY_CARE_PROVIDER_SITE_OTHER): Payer: Medicaid Other | Admitting: Advanced Practice Midwife

## 2015-07-14 VITALS — BP 110/70 | HR 96 | Wt 125.0 lb

## 2015-07-14 DIAGNOSIS — O09893 Supervision of other high risk pregnancies, third trimester: Secondary | ICD-10-CM

## 2015-07-14 DIAGNOSIS — F1410 Cocaine abuse, uncomplicated: Secondary | ICD-10-CM

## 2015-07-14 DIAGNOSIS — O09213 Supervision of pregnancy with history of pre-term labor, third trimester: Secondary | ICD-10-CM

## 2015-07-14 DIAGNOSIS — F1411 Cocaine abuse, in remission: Secondary | ICD-10-CM

## 2015-07-14 DIAGNOSIS — O26843 Uterine size-date discrepancy, third trimester: Secondary | ICD-10-CM

## 2015-07-14 DIAGNOSIS — Z3493 Encounter for supervision of normal pregnancy, unspecified, third trimester: Secondary | ICD-10-CM

## 2015-07-14 DIAGNOSIS — Z1389 Encounter for screening for other disorder: Secondary | ICD-10-CM

## 2015-07-14 DIAGNOSIS — Z331 Pregnant state, incidental: Secondary | ICD-10-CM

## 2015-07-14 MED ORDER — HYDROXYPROGESTERONE CAPROATE 250 MG/ML IM OIL
250.0000 mg | TOPICAL_OIL | Freq: Once | INTRAMUSCULAR | Status: AC
  Administered 2015-07-14: 250 mg via INTRAMUSCULAR

## 2015-07-14 NOTE — Progress Notes (Signed)
Pt denies any problems or concerns at this time.  

## 2015-07-14 NOTE — Progress Notes (Signed)
[id number] [redacted]w[redacted]d Estimated Date of Delivery: 08/21/15  Blood pressure 110/70, pulse 96, weight 125 lb (56.7 kg), last menstrual period 09/29/2014, unknown if currently breastfeeding.   BP weight reviewed.  Pt states that she cannot provide a urine sample despite being in office for over an hour and drinking water.    Please refer to the obstetrical flow sheet for the fundal height and fetal heart rate documentation:  Patient reports good fetal movement, denies any bleeding and no rupture of membranes symptoms or regular contractions. Patient is without complaints. All questions were answered.  Orders Placed This Encounter  Procedures  . US OB Follow Up  . Pain Management Screening Profile (10S)  . POCT urinalysis dipstick    Plan:  Continued routine obstetrical care,   Return in about 1 week (around 07/21/2015) for US:EFW, LROB.  UDS NEXT VISIT

## 2015-07-14 NOTE — Patient Instructions (Signed)

## 2015-07-19 LAB — PMP SCREEN PROFILE (10S), URINE

## 2015-07-23 ENCOUNTER — Encounter: Payer: Self-pay | Admitting: Women's Health

## 2015-07-23 ENCOUNTER — Encounter: Payer: Medicaid Other | Admitting: Women's Health

## 2015-07-23 ENCOUNTER — Other Ambulatory Visit: Payer: Medicaid Other

## 2015-08-12 ENCOUNTER — Telehealth: Payer: Self-pay | Admitting: *Deleted

## 2015-08-12 ENCOUNTER — Encounter (HOSPITAL_COMMUNITY): Payer: Self-pay | Admitting: *Deleted

## 2015-08-12 ENCOUNTER — Inpatient Hospital Stay (HOSPITAL_COMMUNITY)
Admission: AD | Admit: 2015-08-12 | Discharge: 2015-08-12 | Disposition: A | Discharge status: Home / Self Care | Payer: Medicaid Other | Source: Ambulatory Visit | Attending: Obstetrics & Gynecology | Admitting: Obstetrics & Gynecology

## 2015-08-12 DIAGNOSIS — O99333 Smoking (tobacco) complicating pregnancy, third trimester: Secondary | ICD-10-CM | POA: Insufficient documentation

## 2015-08-12 DIAGNOSIS — O368130 Decreased fetal movements, third trimester, not applicable or unspecified: Secondary | ICD-10-CM | POA: Insufficient documentation

## 2015-08-12 DIAGNOSIS — O34219 Maternal care for unspecified type scar from previous cesarean delivery: Secondary | ICD-10-CM | POA: Diagnosis not present

## 2015-08-12 DIAGNOSIS — Z3A38 38 weeks gestation of pregnancy: Secondary | ICD-10-CM | POA: Insufficient documentation

## 2015-08-12 DIAGNOSIS — Z98891 History of uterine scar from previous surgery: Secondary | ICD-10-CM

## 2015-08-12 DIAGNOSIS — F17210 Nicotine dependence, cigarettes, uncomplicated: Secondary | ICD-10-CM | POA: Insufficient documentation

## 2015-08-12 DIAGNOSIS — O99820 Streptococcus B carrier state complicating pregnancy: Secondary | ICD-10-CM

## 2015-08-12 DIAGNOSIS — O0932 Supervision of pregnancy with insufficient antenatal care, second trimester: Secondary | ICD-10-CM

## 2015-08-12 DIAGNOSIS — O0933 Supervision of pregnancy with insufficient antenatal care, third trimester: Secondary | ICD-10-CM | POA: Insufficient documentation

## 2015-08-12 DIAGNOSIS — Z3493 Encounter for supervision of normal pregnancy, unspecified, third trimester: Secondary | ICD-10-CM

## 2015-08-12 LAB — WET PREP, GENITAL
CLUE CELLS WET PREP: NONE SEEN
TRICH WET PREP: NONE SEEN
Yeast Wet Prep HPF POC: NONE SEEN

## 2015-08-12 LAB — POCT FERN TEST: POCT FERN TEST: NEGATIVE

## 2015-08-12 LAB — OB RESULTS CONSOLE GC/CHLAMYDIA: Gonorrhea: NEGATIVE

## 2015-08-12 NOTE — MAU Provider Note (Signed)
Chief Complaint:  Labor Eval and Decreased Fetal Movement   First Provider Initiated Contact with Patient 08/12/15 1954      HPI: Leslie Christian is a 39 y.o. [id number] at [redacted]w[redacted]d pt of [facility] who presents to maternity admissions reporting abdominal pain and pressure x 3 days that is getting worse. She reports her pain is intermittent and irregular cramping.  She has not tried anything for the pain.  She also reports leakage of clear fluid when she coughs x 2 days, enough to wet her underwear intermittently but not enough to require a pad. She reports fetal movement in MAU but reports it has been less than usual the last few days.  She denies vaginal bleeding, vaginal itching/burning, urinary symptoms, h/a, dizziness, n/v, or fever/chills.    She has hx of C/S, then VBAC, then C/S. She desires VBAC if labors on her own with this pregnancy.  Consent signed.   History also significant for substance abuse with positive testing for THC and Benzos during this pregnancy.   Abdominal Cramping This is a recurrent problem. The current episode started in the past 7 days. The onset quality is gradual. The problem occurs intermittently. The most recent episode lasted 3 days. The problem has been gradually worsening. The pain is located in the generalized abdominal region. The pain is moderate. The quality of the pain is cramping. The abdominal pain radiates to the back. Pertinent negatives include no constipation, diarrhea, dysuria, fever, frequency, headaches, nausea or vomiting. Nothing aggravates the pain. She has tried nothing for the symptoms.    Past Medical History: Past Medical History  Diagnosis Date  . Asthma   . COPD (chronic obstructive pulmonary disease) (HCC)   . Atrial fibrillation (HCC)   . Mitral valve prolapse   . Chronic back pain   . Chronic neck pain   . Migraine headache   . Anxiety   . Polysubstance abuse     Hx of cocaine, opiates, marijuana use  . Seizures (HCC)     Last  seizure 9/16    Past obstetric history: OB History  Gravida Para Term Preterm AB SAB TAB Ectopic Multiple Living  0 0 4    # Outcome Date GA Lbr Len/2nd Weight Sex Delivery Anes PTL Lv  9 Current           8 Preterm 07/09/13 [redacted]w[redacted]d  2.211 kg (4 lb 14 oz) M CS-LTranv Spinal  Y  7 Term 03/14/04 [redacted]w[redacted]d  2.722 kg (6 lb) M VBAC  N Y  6 Preterm 10/25/02 [redacted]w[redacted]d  2.75 kg (6 lb 1 oz) M CS-LTranv   Y     Complications: Placental abruption  5 Preterm 08/07/94   2.268 kg (5 lb)  Vag-Spont   Y  4 TAB           3 TAB           2 SAB           1 SAB               Past Surgical History: Past Surgical History  Procedure Laterality Date  . Cesarean section    . Endoscopy    . Cesarean section N/A 07/09/2013    Procedure: CESAREAN SECTION;  Surgeon: Reva Bores, MD;  Location: [facility];  Service: Obstetrics;  Laterality: N/A;  . Examination under anesthesia N/A 07/20/2013    Procedure: EXAM UNDER ANESTHESIA / Repair of traumatic  dehisence of cesarean incision..;  Surgeon: Allie Bossier, MD;  Location: [facility];  Service: Gynecology;  Laterality: N/A;  . Repair traumatic dehisence of cesarean section      Family History: Family History  Problem Relation Age of Onset  . COPD Mother   . Hypertension Mother   . Hyperlipidemia Mother   . Bipolar disorder Mother   . Epilepsy Son   . Heart disease Maternal Grandmother     Social History: Social History  Substance Use Topics  . Smoking status: Current Every Day Smoker -- 0.50 packs/day for 24 years    Types: Cigarettes  . Smokeless tobacco: Never Used  . Alcohol Use: No     Comment: Quit when she found out she was pregnant    Allergies:  Allergies  Allergen Reactions  . Latex Hives, Itching and Rash    Meds:  Prescriptions prior to admission  Medication Sig Dispense Refill Last Dose  . acetaminophen (TYLENOL) 500 MG tablet Take 1,000 mg by mouth every 6 (six) hours as needed for moderate pain.   Past Week at Unknown time  .  albuterol (PROVENTIL HFA;VENTOLIN HFA) 108 (90 BASE) MCG/ACT inhaler Inhale 1-2 puffs into the lungs every 4 (four) hours as needed for wheezing or shortness of breath. (Patient taking differently: Inhale 2 puffs into the lungs every 6 (six) hours as needed for wheezing or shortness of breath. ) 1 Inhaler 3 08/11/2015 at Unknown time  . calcium carbonate (TUMS EX) 750 MG chewable tablet Chew 1-2 tablets by mouth daily as needed for heartburn.   Past Week at Unknown time  . escitalopram (LEXAPRO) 10 MG tablet Take 1 tablet (10 mg total) by mouth daily. 30 tablet 3 08/11/2015 at Unknown time  . Fluticasone-Salmeterol (ADVAIR DISKUS) 250-50 MCG/DOSE AEPB USE 1 INHALATION BY MOUTH EVERY 12 HOURS. <<RINSE MOUTH AFTER USE>>. (Patient taking differently: Inhale 1 puff into the lungs daily. USE 1 INHALATION BY MOUTH EVERY 12 HOURS. <<RINSE MOUTH AFTER USE>>.) 60 each 3 08/11/2015 at Unknown time  . hydrocortisone cream 1 % Apply 1 application topically 2 (two) times daily as needed for itching (maybe insect bites).   Past Week at Unknown time  . Prenatal Vit-Fe Fumarate-FA (PRENATAL MULTIVITAMIN) TABS tablet Take 1 tablet by mouth daily at 12 noon.   08/12/2015 at 1000  . traMADol (ULTRAM) 50 MG tablet Take 1 tablet (50 mg total) by mouth every 6 (six) hours as needed for moderate pain. 30 tablet 0 08/12/2015 at 1000    ROS:  Review of Systems  Constitutional: Negative for fever, chills and fatigue.  HENT: Negative for sinus pressure.   Eyes: Negative for photophobia.  Respiratory: Negative for shortness of breath.   Cardiovascular: Negative for chest pain.  Gastrointestinal: Negative for nausea, vomiting, diarrhea and constipation.  Genitourinary: Negative for dysuria, frequency, flank pain, vaginal bleeding, vaginal discharge, difficulty urinating, vaginal pain and pelvic pain.  Musculoskeletal: Negative for neck pain.  Neurological: Negative for dizziness, weakness and headaches.   Psychiatric/Behavioral: Negative.      I have reviewed patient's Past Medical Hx, Surgical Hx, Family Hx, Social Hx, medications and allergies.   Physical Exam  Patient Vitals for the past 24 hrs:  BP Temp Temp src Pulse Resp Weight  08/12/15 1834 121/81 mmHg 97.5 F (36.4 C) Axillary 66 18 57.153 kg (126 lb)   Constitutional: Well-developed, well-nourished female in no acute distress.  Cardiovascular: normal rate Respiratory: normal effort GI: Abd soft, non-tender, gravid appropriate for gestational age.  MS: Extremities nontender, no edema, normal ROM Neurologic: Alert and oriented x 4.  GU: Neg CVAT.  PELVIC EXAM: Cervix pink, visually closed, without lesion, small amount thick white discharge, vaginal walls and external genitalia normal Bimanual exam: Cervix 0/long/high, firm, anterior, neg CMT, uterus nontender, nonenlarged, adnexa without tenderness, enlargement, or mass  Dilation: 4 Effacement (%): 70 Station: -3 Presentation: Vertex Exam by:: Sharen CounterLisa Leftwich-Kirby, CNM  FHT:  Baseline 135 , moderate variability, accelerations present, no decelerations Contractions: irregular, q 3-15 min   Labs: Results for orders placed or performed during the hospital encounter of 08/12/15 (from the past 24 hour(s))  Fern Test     Status: Normal   Collection Time: 08/12/15  8:09 PM  Result Value Ref Range   POCT Fern Test Negative = intact amniotic membranes    A/--/-- (06/14 1635)  Imaging:  No results found.  MAU Course/MDM: I have ordered labs and reviewed results.  Negative pooling and ferning so no rupture of membranes. Cervix unchanged in 1 hour in MAU.  FHR tracing reactive.  Pt to follow up with routine prenatal care.  Pt stable at time of discharge.  Assessment: 1. History of low transverse cesarean section   2. Late prenatal care in second trimester   3. Supervision of normal pregnancy, third trimester     Plan: D/C home with labor precautions Fetal kick  counts reviewed GBS/GCC collected  [facility] for [facility]    Medication List    ASK your doctor about these medications        acetaminophen 500 MG tablet  Commonly known as:  TYLENOL  Take 1,000 mg by mouth every 6 (six) hours as needed for moderate pain.     albuterol 108 (90 BASE) MCG/ACT inhaler  Commonly known as:  PROVENTIL HFA;VENTOLIN HFA  Inhale 1-2 puffs into the lungs every 4 (four) hours as needed for wheezing or shortness of breath.     calcium carbonate 750 MG chewable tablet  Commonly known as:  TUMS EX  Chew 1-2 tablets by mouth daily as needed for heartburn.     escitalopram 10 MG tablet  Commonly known as:  LEXAPRO  Take 1 tablet (10 mg total) by mouth daily.     Fluticasone-Salmeterol 250-50 MCG/DOSE Aepb  Commonly known as:  ADVAIR DISKUS  USE 1 INHALATION BY MOUTH EVERY 12 HOURS. <<RINSE MOUTH AFTER USE>>.     hydrocortisone cream 1 %  Apply 1 application topically 2 (two) times daily as needed for itching (maybe insect bites).     prenatal multivitamin Tabs tablet  Take 1 tablet by mouth daily at 12 noon.     traMADol 50 MG tablet  Commonly known as:  ULTRAM  Take 1 tablet (50 mg total) by mouth every 6 (six) hours as needed for moderate pain.        Sharen CounterLisa Leftwich-Kirby Certified Nurse-Midwife 08/12/2015 8:16 PM

## 2015-08-12 NOTE — Discharge Instructions (Signed)
Third Trimester of Pregnancy °The third trimester is from week 29 through week 42, months 7 through 9. The third trimester is a time when the fetus is growing rapidly. At the end of the ninth month, the fetus is about 20 inches in length and weighs 6-10 pounds.  °BODY CHANGES °Your body goes through many changes during pregnancy. The changes vary from woman to woman.  °· Your weight will continue to increase. You can expect to gain 25-35 pounds (11-16 kg) by the end of the pregnancy. °· You may begin to get stretch marks on your hips, abdomen, and breasts. °· You may urinate more often because the fetus is moving lower into your pelvis and pressing on your bladder. °· You may develop or continue to have heartburn as a result of your pregnancy. °· You may develop constipation because certain hormones are causing the muscles that push waste through your intestines to slow down. °· You may develop hemorrhoids or swollen, bulging veins (varicose veins). °· You may have pelvic pain because of the weight gain and pregnancy hormones relaxing your joints between the bones in your pelvis. Backaches may result from overexertion of the muscles supporting your posture. °· You may have changes in your hair. These can include thickening of your hair, rapid growth, and changes in texture. Some women also have hair loss during or after pregnancy, or hair that feels dry or thin. Your hair will most likely return to normal after your baby is born. °· Your breasts will continue to grow and be tender. A yellow discharge may leak from your breasts called colostrum. °· Your belly button may stick out. °· You may feel short of breath because of your expanding uterus. °· You may notice the fetus "dropping," or moving lower in your abdomen. °· You may have a bloody mucus discharge. This usually occurs a few days to a week before labor begins. °· Your cervix becomes thin and soft (effaced) near your due date. °WHAT TO EXPECT AT YOUR PRENATAL  EXAMS  °You will have prenatal exams every 2 weeks until week 36. Then, you will have weekly prenatal exams. During a routine prenatal visit: °· You will be weighed to make sure you and the fetus are growing normally. °· Your blood pressure is taken. °· Your abdomen will be measured to track your baby's growth. °· The fetal heartbeat will be listened to. °· Any test results from the previous visit will be discussed. °· You may have a cervical check near your due date to see if you have effaced. °At around 36 weeks, your caregiver will check your cervix. At the same time, your caregiver will also perform a test on the secretions of the vaginal tissue. This test is to determine if a type of bacteria, Group B streptococcus, is present. Your caregiver will explain this further. °Your caregiver may ask you: °· What your birth plan is. °· How you are feeling. °· If you are feeling the baby move. °· If you have had any abnormal symptoms, such as leaking fluid, bleeding, severe headaches, or abdominal cramping. °· If you are using any tobacco products, including cigarettes, chewing tobacco, and electronic cigarettes. °· If you have any questions. °Other tests or screenings that may be performed during your third trimester include: °· Blood tests that check for low iron levels (anemia). °· Fetal testing to check the health, activity level, and growth of the fetus. Testing is done if you have certain medical conditions or if   there are problems during the pregnancy. °· HIV (human immunodeficiency virus) testing. If you are at high risk, you may be screened for HIV during your third trimester of pregnancy. °FALSE LABOR °You may feel small, irregular contractions that eventually go away. These are called Braxton Hicks contractions, or false labor. Contractions may last for hours, days, or even weeks before true labor sets in. If contractions come at regular intervals, intensify, or become painful, it is best to be seen by your  caregiver.  °SIGNS OF LABOR  °· Menstrual-like cramps. °· Contractions that are 5 minutes apart or less. °· Contractions that start on the top of the uterus and spread down to the lower abdomen and back. °· A sense of increased pelvic pressure or back pain. °· A watery or bloody mucus discharge that comes from the vagina. °If you have any of these signs before the 37th week of pregnancy, call your caregiver right away. You need to go to the hospital to get checked immediately. °HOME CARE INSTRUCTIONS  °· Avoid all smoking, herbs, alcohol, and unprescribed drugs. These chemicals affect the formation and growth of the baby. °· Do not use any tobacco products, including cigarettes, chewing tobacco, and electronic cigarettes. If you need help quitting, ask your health care provider. You may receive counseling support and other resources to help you quit. °· Follow your caregiver's instructions regarding medicine use. There are medicines that are either safe or unsafe to take during pregnancy. °· Exercise only as directed by your caregiver. Experiencing uterine cramps is a good sign to stop exercising. °· Continue to eat regular, healthy meals. °· Wear a good support bra for breast tenderness. °· Do not use hot tubs, steam rooms, or saunas. °· Wear your seat belt at all times when driving. °· Avoid raw meat, uncooked cheese, cat litter boxes, and soil used by cats. These carry germs that can cause birth defects in the baby. °· Take your prenatal vitamins. °· Take 1500-2000 mg of calcium daily starting at the 20th week of pregnancy until you deliver your baby. °· Try taking a stool softener (if your caregiver approves) if you develop constipation. Eat more high-fiber foods, such as fresh vegetables or fruit and whole grains. Drink plenty of fluids to keep your urine clear or pale yellow. °· Take warm sitz baths to soothe any pain or discomfort caused by hemorrhoids. Use hemorrhoid cream if your caregiver approves. °· If  you develop varicose veins, wear support hose. Elevate your feet for 15 minutes, 3-4 times a day. Limit salt in your diet. °· Avoid heavy lifting, wear low heal shoes, and practice good posture. °· Rest a lot with your legs elevated if you have leg cramps or low back pain. °· Visit your dentist if you have not gone during your pregnancy. Use a soft toothbrush to brush your teeth and be gentle when you floss. °· A sexual relationship may be continued unless your caregiver directs you otherwise. °· Do not travel far distances unless it is absolutely necessary and only with the approval of your caregiver. °· Take prenatal classes to understand, practice, and ask questions about the labor and delivery. °· Make a trial run to the hospital. °· Pack your hospital bag. °· Prepare the baby's nursery. °· Continue to go to all your prenatal visits as directed by your caregiver. °SEEK MEDICAL CARE IF: °· You are unsure if you are in labor or if your water has broken. °· You have dizziness. °· You have   mild pelvic cramps, pelvic pressure, or nagging pain in your abdominal area. °· You have persistent nausea, vomiting, or diarrhea. °· You have a bad smelling vaginal discharge. °· You have pain with urination. °SEEK IMMEDIATE MEDICAL CARE IF:  °· You have a fever. °· You are leaking fluid from your vagina. °· You have spotting or bleeding from your vagina. °· You have severe abdominal cramping or pain. °· You have rapid weight loss or gain. °· You have shortness of breath with chest pain. °· You notice sudden or extreme swelling of your face, hands, ankles, feet, or legs. °· You have not felt your baby move in over an hour. °· You have severe headaches that do not go away with medicine. °· You have vision changes. °  °This information is not intended to replace advice given to you by your health care provider. Make sure you discuss any questions you have with your health care provider. °  °Document Released: 10/10/2001 Document  Revised: 11/06/2014 Document Reviewed: 12/17/2012 °[employer] Interactive Patient Education ©2016 [employer] °Fetal Movement Counts °Patient Name: __________________________________________________ Patient Due Date: ____________________ °Performing a fetal movement count is highly recommended in high-risk pregnancies, but it is good for every pregnant woman to do. Your health care provider may ask you to start counting fetal movements at 28 weeks of the pregnancy. Fetal movements often increase: °· After eating a full meal. °· After physical activity. °· After eating or drinking something sweet or cold. °· At rest. °Pay attention to when you feel the baby is most active. This will help you notice a pattern of your baby's sleep and wake cycles and what factors contribute to an increase in fetal movement. It is important to perform a fetal movement count at the same time each day when your baby is normally most active.  °HOW TO COUNT FETAL MOVEMENTS °· Find a quiet and comfortable area to sit or lie down on your left side. Lying on your left side provides the best blood and oxygen circulation to your baby. °· Write down the day and time on a sheet of paper or in a journal. °· Start counting kicks, flutters, swishes, rolls, or jabs in a 2-hour period. You should feel at least 10 movements within 2 hours. °· If you do not feel 10 movements in 2 hours, wait 2-3 hours and count again. Look for a change in the pattern or not enough counts in 2 hours. °SEEK MEDICAL CARE IF: °· You feel less than 10 counts in 2 hours, tried twice. °· There is no movement in over an hour. °· The pattern is changing or taking longer each day to reach 10 counts in 2 hours. °· You feel the baby is not moving as he or she usually does. °Date: ____________ Movements: ____________ Start time: ____________ Finish time: ____________  °Date: ____________ Movements: ____________ Start time: ____________ Finish time: ____________ °Date: ____________  Movements: ____________ Start time: ____________ Finish time: ____________ °Date: ____________ Movements: ____________ Start time: ____________ Finish time: ____________ °Date: ____________ Movements: ____________ Start time: ____________ Finish time: ____________ °Date: ____________ Movements: ____________ Start time: ____________ Finish time: ____________ °Date: ____________ Movements: ____________ Start time: ____________ Finish time: ____________ °Date: ____________ Movements: ____________ Start time: ____________ Finish time: ____________  °Date: ____________ Movements: ____________ Start time: ____________ Finish time: ____________ °Date: ____________ Movements: ____________ Start time: ____________ Finish time: ____________ °Date: ____________ Movements: ____________ Start time: ____________ Finish time: ____________ °Date: ____________ Movements: ____________ Start time: ____________ Finish time: ____________ °Date:   ____________ Movements: ____________ Start time: ____________ Finish time: ____________ °Date: ____________ Movements: ____________ Start time: ____________ Finish time: ____________ °Date: ____________ Movements: ____________ Start time: ____________ Finish time: ____________  °Date: ____________ Movements: ____________ Start time: ____________ Finish time: ____________ °Date: ____________ Movements: ____________ Start time: ____________ Finish time: ____________ °Date: ____________ Movements: ____________ Start time: ____________ Finish time: ____________ °Date: ____________ Movements: ____________ Start time: ____________ Finish time: ____________ °Date: ____________ Movements: ____________ Start time: ____________ Finish time: ____________ °Date: ____________ Movements: ____________ Start time: ____________ Finish time: ____________ °Date: ____________ Movements: ____________ Start time: ____________ Finish time: ____________  °Date: ____________ Movements: ____________ Start time:  ____________ Finish time: ____________ °Date: ____________ Movements: ____________ Start time: ____________ Finish time: ____________ °Date: ____________ Movements: ____________ Start time: ____________ Finish time: ____________ °Date: ____________ Movements: ____________ Start time: ____________ Finish time: ____________ °Date: ____________ Movements: ____________ Start time: ____________ Finish time: ____________ °Date: ____________ Movements: ____________ Start time: ____________ Finish time: ____________ °Date: ____________ Movements: ____________ Start time: ____________ Finish time: ____________  °Date: ____________ Movements: ____________ Start time: ____________ Finish time: ____________ °Date: ____________ Movements: ____________ Start time: ____________ Finish time: ____________ °Date: ____________ Movements: ____________ Start time: ____________ Finish time: ____________ °Date: ____________ Movements: ____________ Start time: ____________ Finish time: ____________ °Date: ____________ Movements: ____________ Start time: ____________ Finish time: ____________ °Date: ____________ Movements: ____________ Start time: ____________ Finish time: ____________ °Date: ____________ Movements: ____________ Start time: ____________ Finish time: ____________  °Date: ____________ Movements: ____________ Start time: ____________ Finish time: ____________ °Date: ____________ Movements: ____________ Start time: ____________ Finish time: ____________ °Date: ____________ Movements: ____________ Start time: ____________ Finish time: ____________ °Date: ____________ Movements: ____________ Start time: ____________ Finish time: ____________ °Date: ____________ Movements: ____________ Start time: ____________ Finish time: ____________ °Date: ____________ Movements: ____________ Start time: ____________ Finish time: ____________ °Date: ____________ Movements: ____________ Start time: ____________ Finish time: ____________  °Date:  ____________ Movements: ____________ Start time: ____________ Finish time: ____________ °Date: ____________ Movements: ____________ Start time: ____________ Finish time: ____________ °Date: ____________ Movements: ____________ Start time: ____________ Finish time: ____________ °Date: ____________ Movements: ____________ Start time: ____________ Finish time: ____________ °Date: ____________ Movements: ____________ Start time: ____________ Finish time: ____________ °Date: ____________ Movements: ____________ Start time: ____________ Finish time: ____________ °Date: ____________ Movements: ____________ Start time: ____________ Finish time: ____________  °Date: ____________ Movements: ____________ Start time: ____________ Finish time: ____________ °Date: ____________ Movements: ____________ Start time: ____________ Finish time: ____________ °Date: ____________ Movements: ____________ Start time: ____________ Finish time: ____________ °Date: ____________ Movements: ____________ Start time: ____________ Finish time: ____________ °Date: ____________ Movements: ____________ Start time: ____________ Finish time: ____________ °Date: ____________ Movements: ____________ Start time: ____________ Finish time: ____________ °  °This information is not intended to replace advice given to you by your health care provider. Make sure you discuss any questions you have with your health care provider. °  °Document Released: 11/15/2006 Document Revised: 11/06/2014 Document Reviewed: 08/12/2012 °[employer] Interactive Patient Education ©2016 [employer] °Braxton Hicks Contractions °Contractions of the uterus can occur throughout pregnancy. Contractions are not always a sign that you are in labor.  °WHAT ARE BRAXTON HICKS CONTRACTIONS?  °Contractions that occur before labor are called Braxton Hicks contractions, or false labor. Toward the end of pregnancy (32-34 weeks), these contractions can develop more often and may become more  forceful. This is not true labor because these contractions do not result in opening (dilatation) and thinning of the cervix. They are sometimes difficult to tell apart from true labor because these contractions can be forceful and people have different pain tolerances. You should   not feel embarrassed if you go to the hospital with false labor. Sometimes, the only way to tell if you are in true labor is for your health care provider to look for changes in the cervix. °If there are no prenatal problems or other health problems associated with the pregnancy, it is completely safe to be sent home with false labor and await the onset of true labor. °HOW CAN YOU TELL THE DIFFERENCE BETWEEN TRUE AND FALSE LABOR? °False Labor °· The contractions of false labor are usually shorter and not as hard as those of true labor.   °· The contractions are usually irregular.   °· The contractions are often felt in the front of the lower abdomen and in the groin.   °· The contractions may go away when you walk around or change positions while lying down.   °· The contractions get weaker and are shorter lasting as time goes on.   °· The contractions do not usually become progressively stronger, regular, and closer together as with true labor.   °True Labor °· Contractions in true labor last 30-70 seconds, become very regular, usually become more intense, and increase in frequency.   °· The contractions do not go away with walking.   °· The discomfort is usually felt in the top of the uterus and spreads to the lower abdomen and low back.   °· True labor can be determined by your health care provider with an exam. This will show that the cervix is dilating and getting thinner.   °WHAT TO REMEMBER °· Keep up with your usual exercises and follow other instructions given by your health care provider.   °· Take medicines as directed by your health care provider.   °· Keep your regular prenatal appointments.   °· Eat and drink lightly if you  think you are going into labor.   °· If Braxton Hicks contractions are making you uncomfortable:   °· Change your position from lying down or resting to walking, or from walking to resting.   °· Sit and rest in a tub of warm water.   °· Drink 2-3 glasses of water. Dehydration may cause these contractions.   °· Do slow and deep breathing several times an hour.   °WHEN SHOULD I SEEK IMMEDIATE MEDICAL CARE? °Seek immediate medical care if: °· Your contractions become stronger, more regular, and closer together.   °· You have fluid leaking or gushing from your vagina.   °· You have a fever.   °· You pass blood-tinged mucus.   °· You have vaginal bleeding.   °· You have continuous abdominal pain.   °· You have low back pain that you never had before.   °· You feel your baby's head pushing down and causing pelvic pressure.   °· Your baby is not moving as much as it used to.   °  °This information is not intended to replace advice given to you by your health care provider. Make sure you discuss any questions you have with your health care provider. °  °Document Released: 10/16/2005 Document Revised: 10/21/2013 Document Reviewed: 07/28/2013 °[employer] Interactive Patient Education ©2016 [employer] ° °

## 2015-08-12 NOTE — MAU Note (Signed)
Having pressure and pains in lower abd and sharp pains in low back.  Baby has not been moving much. ? Leakage, little bit through the day.

## 2015-08-12 NOTE — Telephone Encounter (Signed)
Pt going to hospital  

## 2015-08-13 ENCOUNTER — Ambulatory Visit (INDEPENDENT_AMBULATORY_CARE_PROVIDER_SITE_OTHER): Payer: Medicaid Other | Admitting: Obstetrics & Gynecology

## 2015-08-13 ENCOUNTER — Encounter: Payer: Self-pay | Admitting: Obstetrics & Gynecology

## 2015-08-13 VITALS — BP 100/70 | HR 72 | Wt 127.4 lb

## 2015-08-13 DIAGNOSIS — Z3A38 38 weeks gestation of pregnancy: Secondary | ICD-10-CM

## 2015-08-13 DIAGNOSIS — Z331 Pregnant state, incidental: Secondary | ICD-10-CM

## 2015-08-13 DIAGNOSIS — Z3483 Encounter for supervision of other normal pregnancy, third trimester: Secondary | ICD-10-CM

## 2015-08-13 DIAGNOSIS — Z1389 Encounter for screening for other disorder: Secondary | ICD-10-CM

## 2015-08-13 LAB — POCT URINALYSIS DIPSTICK
GLUCOSE UA: NEGATIVE
Ketones, UA: NEGATIVE
LEUKOCYTES UA: NEGATIVE
NITRITE UA: NEGATIVE
PROTEIN UA: NEGATIVE
RBC UA: NEGATIVE

## 2015-08-13 LAB — CERVICOVAGINAL ANCILLARY ONLY
Chlamydia: NEGATIVE
Neisseria Gonorrhea: NEGATIVE

## 2015-08-13 NOTE — MAU Note (Signed)
Mason (Designer, [employer]) called to MAU. Reports this pt's GBS has been cancelled because the swab cannot be found. Mason states he processed it himself but p/u by one of 3 couriers and no one is able to find the swab so it has been cancelled.

## 2015-08-13 NOTE — Progress Notes (Signed)
[id number] Estimated Date of Delivery: 08/21/15  Blood pressure 100/70, pulse 72, weight 127 lb 6.4 oz (57.788 kg), last menstrual period 09/29/2014, unknown if currently breastfeeding.   BP weight and urine results all reviewed and noted.  Please refer to the obstetrical flow sheet for the fundal height and fetal heart rate documentation:  Patient reports good fetal movement, denies any bleeding and no rupture of membranes symptoms or regular contractions. Patient is without complaints. All questions were answered.  Orders Placed This Encounter  Procedures  . POCT urinalysis dipstick    Plan:  Continued routine obstetrical care,   Return in about 1 week (around 08/20/2015) for LROB.

## 2015-08-14 ENCOUNTER — Encounter (HOSPITAL_COMMUNITY): Payer: Self-pay

## 2015-08-14 ENCOUNTER — Encounter (HOSPITAL_COMMUNITY): Payer: Self-pay | Admitting: Anesthesiology

## 2015-08-14 ENCOUNTER — Inpatient Hospital Stay (HOSPITAL_COMMUNITY)
Admission: AD | Admit: 2015-08-14 | Discharge: 2015-08-16 | DRG: 774 | Disposition: A | Discharge status: Home / Self Care | Payer: Medicaid Other | Source: Ambulatory Visit | Attending: Obstetrics and Gynecology | Admitting: Obstetrics and Gynecology

## 2015-08-14 ENCOUNTER — Encounter (HOSPITAL_COMMUNITY)
Admission: AD | Disposition: A | Discharge status: Home / Self Care | Payer: Self-pay | Source: Ambulatory Visit | Attending: Obstetrics and Gynecology

## 2015-08-14 DIAGNOSIS — O99324 Drug use complicating childbirth: Secondary | ICD-10-CM | POA: Diagnosis present

## 2015-08-14 DIAGNOSIS — J449 Chronic obstructive pulmonary disease, unspecified: Secondary | ICD-10-CM | POA: Diagnosis present

## 2015-08-14 DIAGNOSIS — O0932 Supervision of pregnancy with insufficient antenatal care, second trimester: Secondary | ICD-10-CM

## 2015-08-14 DIAGNOSIS — F1910 Other psychoactive substance abuse, uncomplicated: Secondary | ICD-10-CM | POA: Diagnosis present

## 2015-08-14 DIAGNOSIS — F17210 Nicotine dependence, cigarettes, uncomplicated: Secondary | ICD-10-CM | POA: Diagnosis present

## 2015-08-14 DIAGNOSIS — O9942 Diseases of the circulatory system complicating childbirth: Secondary | ICD-10-CM | POA: Diagnosis present

## 2015-08-14 DIAGNOSIS — Z3A39 39 weeks gestation of pregnancy: Secondary | ICD-10-CM

## 2015-08-14 DIAGNOSIS — O9952 Diseases of the respiratory system complicating childbirth: Principal | ICD-10-CM | POA: Diagnosis present

## 2015-08-14 DIAGNOSIS — Z8249 Family history of ischemic heart disease and other diseases of the circulatory system: Secondary | ICD-10-CM

## 2015-08-14 DIAGNOSIS — Z98891 History of uterine scar from previous surgery: Secondary | ICD-10-CM

## 2015-08-14 DIAGNOSIS — J45909 Unspecified asthma, uncomplicated: Secondary | ICD-10-CM | POA: Diagnosis present

## 2015-08-14 DIAGNOSIS — IMO0001 Reserved for inherently not codable concepts without codable children: Secondary | ICD-10-CM

## 2015-08-14 DIAGNOSIS — Z820 Family history of epilepsy and other diseases of the nervous system: Secondary | ICD-10-CM

## 2015-08-14 DIAGNOSIS — Z3493 Encounter for supervision of normal pregnancy, unspecified, third trimester: Secondary | ICD-10-CM

## 2015-08-14 DIAGNOSIS — O99334 Smoking (tobacco) complicating childbirth: Secondary | ICD-10-CM

## 2015-08-14 DIAGNOSIS — O34211 Maternal care for low transverse scar from previous cesarean delivery: Secondary | ICD-10-CM

## 2015-08-14 DIAGNOSIS — I341 Nonrheumatic mitral (valve) prolapse: Secondary | ICD-10-CM | POA: Diagnosis present

## 2015-08-14 LAB — TYPE AND SCREEN
ABO/RH(D): A NEG
ANTIBODY SCREEN: NEGATIVE

## 2015-08-14 LAB — RAPID URINE DRUG SCREEN, HOSP PERFORMED
AMPHETAMINES: NOT DETECTED
Barbiturates: NOT DETECTED
Benzodiazepines: NOT DETECTED
Cocaine: NOT DETECTED
OPIATES: NOT DETECTED
Tetrahydrocannabinol: NOT DETECTED

## 2015-08-14 LAB — CBC
HCT: 40.9 % (ref 36.0–46.0)
HEMOGLOBIN: 14.4 g/dL (ref 12.0–15.0)
MCH: 32.4 pg (ref 26.0–34.0)
MCHC: 35.2 g/dL (ref 30.0–36.0)
MCV: 91.9 fL (ref 78.0–100.0)
PLATELETS: 181 10*3/uL (ref 150–400)
RBC: 4.45 MIL/uL (ref 3.87–5.11)
RDW: 14.1 % (ref 11.5–15.5)
WBC: 19.3 10*3/uL — ABNORMAL HIGH (ref 4.0–10.5)

## 2015-08-14 LAB — CULTURE, BETA STREP (GROUP B ONLY)

## 2015-08-14 LAB — MRSA PCR SCREENING: MRSA by PCR: NEGATIVE

## 2015-08-14 LAB — RPR: RPR Ser Ql: NONREACTIVE

## 2015-08-14 SURGERY — LIGATION, FALLOPIAN TUBE, POSTPARTUM
Anesthesia: Choice | Laterality: Bilateral

## 2015-08-14 MED ORDER — ONDANSETRON HCL 4 MG/2ML IJ SOLN: 4.0000 mg | INTRAMUSCULAR | Status: DC | PRN

## 2015-08-14 MED ORDER — PNEUMOCOCCAL VAC POLYVALENT 25 MCG/0.5ML IJ INJ
0.5000 mL | INJECTION | INTRAMUSCULAR | Status: DC
  Filled 2015-08-14: qty 0.5

## 2015-08-14 MED ORDER — BENZOCAINE-MENTHOL 20-0.5 % EX AERO: 1.0000 "application " | INHALATION_SPRAY | CUTANEOUS | Status: DC | PRN

## 2015-08-14 MED ORDER — OXYCODONE-ACETAMINOPHEN 5-325 MG PO TABS: 2.0000 | ORAL_TABLET | ORAL | Status: DC | PRN

## 2015-08-14 MED ORDER — MOMETASONE FURO-FORMOTEROL FUM 100-5 MCG/ACT IN AERO
2.0000 | INHALATION_SPRAY | Freq: Two times a day (BID) | RESPIRATORY_TRACT | Status: DC
  Filled 2015-08-14: qty 8.8

## 2015-08-14 MED ORDER — RHO D IMMUNE GLOBULIN 1500 UNIT/2ML IJ SOSY
300.0000 ug | PREFILLED_SYRINGE | Freq: Once | INTRAMUSCULAR | Status: AC
  Administered 2015-08-14: 300 ug via INTRAVENOUS
  Filled 2015-08-14: qty 2

## 2015-08-14 MED ORDER — WITCH HAZEL-GLYCERIN EX PADS
1.0000 | MEDICATED_PAD | CUTANEOUS | Status: DC | PRN
Start: 2015-08-14 — End: 2015-08-16

## 2015-08-14 MED ORDER — LACTATED RINGERS IV SOLN: INTRAVENOUS | Status: DC

## 2015-08-14 MED ORDER — LANOLIN HYDROUS EX OINT: TOPICAL_OINTMENT | CUTANEOUS | Status: DC | PRN

## 2015-08-14 MED ORDER — ONDANSETRON HCL 4 MG/2ML IJ SOLN: 4.0000 mg | Freq: Four times a day (QID) | INTRAMUSCULAR | Status: DC | PRN

## 2015-08-14 MED ORDER — ESCITALOPRAM OXALATE 10 MG PO TABS
10.0000 mg | ORAL_TABLET | Freq: Every day | ORAL | Status: DC
  Filled 2015-08-14: qty 1

## 2015-08-14 MED ORDER — OXYCODONE-ACETAMINOPHEN 5-325 MG PO TABS: 1.0000 | ORAL_TABLET | ORAL | Status: DC | PRN

## 2015-08-14 MED ORDER — OXYTOCIN 40 UNITS IN LACTATED RINGERS INFUSION - SIMPLE MED: 62.5000 mL/h | INTRAVENOUS | Status: DC

## 2015-08-14 MED ORDER — ONDANSETRON HCL 4 MG PO TABS: 4.0000 mg | ORAL_TABLET | ORAL | Status: DC | PRN

## 2015-08-14 MED ORDER — LIDOCAINE HCL (PF) 1 % IJ SOLN
INTRAMUSCULAR | Status: AC
  Filled 2015-08-14: qty 30

## 2015-08-14 MED ORDER — SENNOSIDES-DOCUSATE SODIUM 8.6-50 MG PO TABS
2.0000 | ORAL_TABLET | ORAL | Status: DC
  Administered 2015-08-14 – 2015-08-15 (×2): 2 via ORAL
  Filled 2015-08-14 (×2): qty 2

## 2015-08-14 MED ORDER — ZOLPIDEM TARTRATE 5 MG PO TABS: 5.0000 mg | ORAL_TABLET | Freq: Every evening | ORAL | Status: DC | PRN

## 2015-08-14 MED ORDER — DIPHENHYDRAMINE HCL 25 MG PO CAPS: 25.0000 mg | ORAL_CAPSULE | Freq: Four times a day (QID) | ORAL | Status: DC | PRN

## 2015-08-14 MED ORDER — OXYTOCIN BOLUS FROM INFUSION: 500.0000 mL | INTRAVENOUS | Status: DC

## 2015-08-14 MED ORDER — ACETAMINOPHEN 325 MG PO TABS: 650.0000 mg | ORAL_TABLET | ORAL | Status: DC | PRN

## 2015-08-14 MED ORDER — OXYTOCIN 40 UNITS IN LACTATED RINGERS INFUSION - SIMPLE MED
INTRAVENOUS | Status: AC
Start: 2015-08-14 — End: 2015-08-14
  Administered 2015-08-14: 02:00:00
  Filled 2015-08-14: qty 1000

## 2015-08-14 MED ORDER — IBUPROFEN 600 MG PO TABS
600.0000 mg | ORAL_TABLET | Freq: Four times a day (QID) | ORAL | Status: DC
  Administered 2015-08-14 – 2015-08-16 (×3): 600 mg via ORAL
  Filled 2015-08-14 (×6): qty 1

## 2015-08-14 MED ORDER — TETANUS-DIPHTH-ACELL PERTUSSIS 5-2.5-18.5 LF-MCG/0.5 IM SUSP
0.5000 mL | Freq: Once | INTRAMUSCULAR | Status: AC
  Administered 2015-08-14: 0.5 mL via INTRAMUSCULAR
  Filled 2015-08-14: qty 0.5

## 2015-08-14 MED ORDER — INFLUENZA VAC SPLIT QUAD 0.5 ML IM SUSY
0.5000 mL | PREFILLED_SYRINGE | INTRAMUSCULAR | Status: AC
  Administered 2015-08-14: 0.5 mL via INTRAMUSCULAR

## 2015-08-14 MED ORDER — CITRIC ACID-SODIUM CITRATE 334-500 MG/5ML PO SOLN: 30.0000 mL | ORAL | Status: DC | PRN

## 2015-08-14 MED ORDER — LACTATED RINGERS IV SOLN: 500.0000 mL | INTRAVENOUS | Status: DC | PRN

## 2015-08-14 MED ORDER — ACETAMINOPHEN 325 MG PO TABS
650.0000 mg | ORAL_TABLET | ORAL | Status: DC | PRN
  Administered 2015-08-14 – 2015-08-16 (×8): 650 mg via ORAL
  Filled 2015-08-14 (×8): qty 2

## 2015-08-14 MED ORDER — TRAMADOL HCL 50 MG PO TABS
50.0000 mg | ORAL_TABLET | Freq: Four times a day (QID) | ORAL | Status: DC | PRN
  Administered 2015-08-14 – 2015-08-16 (×9): 50 mg via ORAL
  Filled 2015-08-14 (×9): qty 1

## 2015-08-14 MED ORDER — PRENATAL MULTIVITAMIN CH
1.0000 | ORAL_TABLET | Freq: Every day | ORAL | Status: DC
  Administered 2015-08-14 – 2015-08-16 (×3): 1 via ORAL
  Filled 2015-08-14 (×3): qty 1

## 2015-08-14 MED ORDER — SIMETHICONE 80 MG PO CHEW: 80.0000 mg | CHEWABLE_TABLET | ORAL | Status: DC | PRN

## 2015-08-14 MED ORDER — LIDOCAINE HCL (PF) 1 % IJ SOLN: 30.0000 mL | INTRAMUSCULAR | Status: DC | PRN

## 2015-08-14 MED ORDER — DIBUCAINE 1 % RE OINT: 1.0000 "application " | TOPICAL_OINTMENT | RECTAL | Status: DC | PRN

## 2015-08-14 MED ORDER — FENTANYL CITRATE (PF) 100 MCG/2ML IJ SOLN: 100.0000 ug | INTRAMUSCULAR | Status: DC | PRN

## 2015-08-14 NOTE — MAU Note (Signed)
Contractions for several hrs and stronger last 2 hrs. Leaking alittle fld. 4cm earlier today

## 2015-08-14 NOTE — MAU Note (Signed)
PT  ARRIVED   - HURTING  WITH UC.

## 2015-08-14 NOTE — H&P (Signed)
Leslie Christian is a 39 y.o. female [phone] @ 39.0wks by 18wk scan presenting for eval of ctx/labor. ? Leaking fluid; +bloody show; +FM. Her [facility] and has been remarkable for 1) asthma/COPD 2) smoker 3) arial fib & mitral valve prolapse dx by PCP but never w/ cardio eval 4) polysubstance abuse of cocaine/opiates/benzos/THC 5) hx seizures- no meds- last 9/16 6) hx C/S x 2 for abruption & breech 7) AMA. She has been consented this preg for a VBAC as well as a BTL.  History OB History    Gravida Para Term Preterm AB TAB SAB Ectopic Multiple Living   9 4 1 3 4 2 2  0 0 4     Past Medical History  Diagnosis Date  . Asthma   . COPD (chronic obstructive pulmonary disease) (HCC)   . Atrial fibrillation (HCC)   . Mitral valve prolapse   . Chronic back pain   . Chronic neck pain   . Migraine headache   . Anxiety   . Polysubstance abuse     Hx of cocaine, opiates, marijuana use  . Seizures (HCC)     Last seizure 9/16   Past Surgical History  Procedure Laterality Date  . Cesarean section    . Endoscopy    . Cesarean section N/A 07/09/2013    Procedure: CESAREAN SECTION;  Surgeon: Reva Boresanya S Pratt, MD;  Location: [facility];  Service: Obstetrics;  Laterality: N/A;  . Examination under anesthesia N/A 07/20/2013    Procedure: EXAM UNDER ANESTHESIA / Repair of traumatic dehisence of cesarean incision..;  Surgeon: Allie BossierMyra C Dove, MD;  Location: [facility];  Service: Gynecology;  Laterality: N/A;  . Repair traumatic dehisence of cesarean section     Family History: family history includes Bipolar disorder in her mother; COPD in her mother; Epilepsy in her son; Heart disease in her maternal grandmother; Hyperlipidemia in her mother; Hypertension in her mother. Social History:  reports that she has been smoking Cigarettes.  She has a 12 pack-year smoking history. She has never used smokeless tobacco. She reports that she uses illicit drugs (Marijuana and Cocaine). She reports  that she does not drink alcohol.   Prenatal Transfer Tool  Maternal Diabetes: No Genetic Screening: Declined- too late Maternal Ultrasounds/Referrals: Normal Fetal Ultrasounds or other Referrals:  None Maternal Substance Abuse:  Yes:  Type: Smoker, Marijuana, Other: benzos, hx cocaine Significant Maternal Medications:  Meds include: Other:  advair, albuterol, lexapro Significant Maternal Lab Results:  None- GBS unknown Other Comments:  None  ROS  Dilation: 8 Effacement (%): 90 Station: +1 Exam by:: Alla GermanLarrabee Blood pressure 148/84, pulse 68, temperature 97.8 F (36.6 C), temperature source Oral, resp. rate 18, height 5\' 4"  (1.626 m), weight 56.972 kg (125 lb 9.6 oz), last menstrual period 09/29/2014, unknown if currently breastfeeding. Exam Physical Exam  Constitutional: She is oriented to person, place, and time. She appears well-developed.  HENT:  Head: Normocephalic.  Neck: Normal range of motion.  Cardiovascular: Normal rate.   Respiratory: Effort normal.  GI:  On monitor briefly: FHR 130s Ctx q 2-3 mins  Genitourinary: Vagina normal.  Musculoskeletal: Normal range of motion.  Neurological: She is alert and oriented to person, place, and time.  Skin: Skin is warm and dry.  Psychiatric: She has a normal mood and affect. Her behavior is normal. Thought content normal.    Prenatal labs: ABO, Rh: A/--/-- (06/14 1635) Antibody: Negative (06/14 1635) Rubella: Immune (06/14 0000) RPR:  Non Reactive (06/14 1635)  HBsAg: Negative (06/14 1635)  HIV: Non-reactive (06/14 0000)  GBS:     Assessment/Plan: IUP@39 .0wks Advanced labor C/S x 2 w/ plans for VBAC Polysubstance abuse  Admit to [facility] Anticipate SVD Tramadol and ibuprofen for pain meds PP SW consult Plan for PPBTL   Ferne Ellingwood CNM 08/14/2015, 2:10 AM

## 2015-08-14 NOTE — Anesthesia Preprocedure Evaluation (Deleted)
Anesthesia Evaluation  Patient identified by MRN, date of birth, ID band Patient awake    Reviewed: Allergy & Precautions, NPO status , Patient's Chart, lab work & pertinent test results  Airway        Dental   Pulmonary asthma , COPD, Current Smoker,           Cardiovascular negative cardio ROS  + dysrhythmias Atrial Fibrillation      Neuro/Psych  Headaches, Seizures -,  Anxiety    GI/Hepatic negative GI ROS, Neg liver ROS,   Endo/Other  negative endocrine ROS  Renal/GU negative Renal ROS  negative genitourinary   Musculoskeletal negative musculoskeletal ROS (+)   Abdominal   Peds negative pediatric ROS (+)  Hematology negative hematology ROS (+)   Anesthesia Other Findings   Reproductive/Obstetrics (+) Breast feeding                              Lab Results  Component Value Date   WBC 19.3* 08/14/2015   HGB 14.4 08/14/2015   HCT 40.9 08/14/2015   MCV 91.9 08/14/2015   PLT 181 08/14/2015   No results found for: INR, PROTIME  EKG: normal sinus rhythm.  Anesthesia Physical Anesthesia Plan  ASA: III  Anesthesia Plan: Epidural   Post-op Pain Management:    Induction:   Airway Management Planned: Natural Airway and Nasal Cannula  Additional Equipment:   Intra-op Plan:   Post-operative Plan:   Informed Consent: I have reviewed the patients History and Physical, chart, labs and discussed the procedure including the risks, benefits and alternatives for the proposed anesthesia with the patient or authorized representative who has indicated his/her understanding and acceptance.   Dental advisory given  Plan Discussed with: CRNA  Anesthesia Plan Comments:        Anesthesia Quick Evaluation

## 2015-08-14 NOTE — Progress Notes (Signed)
Post Partum Day 0 Subjective: no complaints, up ad lib and complaining of abd cramps, normal lochia. she has decided NOT to get BTL in hospital, states she's "Too tired." pt states she'll get btl [facility].  Pt informed of importance of early pp visit to facilitate scheduling.  Objective: Blood pressure 142/86, pulse 51, temperature 98.5 F (36.9 C), temperature source Oral, resp. rate 20, height 5\' 4"  (1.626 m), weight 56.972 kg (125 lb 9.6 oz), last menstrual period 09/29/2014, SpO2 100 %, unknown if currently breastfeeding.  Physical Exam:  General: alert, cooperative and no distress Lochia: appropriate Uterine Fundus: firm Incision:  DVT Evaluation: No evidence of DVT seen on physical exam.   Recent Labs  08/14/15 0130  HGB 14.4  HCT 40.9    Assessment/Plan: Plan for discharge tomorrow  [facility].   LOS: 0 days   Kelby Adell V 08/14/2015, 7:27 AM

## 2015-08-15 LAB — RH IG WORKUP (INCLUDES ABO/RH)
ABO/RH(D): A NEG
Fetal Screen: NEGATIVE
GESTATIONAL AGE(WKS): 39
Unit division: 0

## 2015-08-15 NOTE — Progress Notes (Signed)
Post Partum Day 1 Subjective: no complaints, up ad lib, voiding and tolerating PO  Objective: Blood pressure 118/55, pulse 48, temperature 98.3 F (36.8 C), temperature source Oral, resp. rate 16, height 5\' 4"  (1.626 m), weight 125 lb 9.6 oz (56.972 kg), last menstrual period 09/29/2014, SpO2 95 %, unknown if currently breastfeeding.  Physical Exam:  General: alert, cooperative and no distress Lochia: appropriate Uterine Fundus: firm Incision: n/a DVT Evaluation: No evidence of DVT seen on physical exam. Negative Homan's sign. No cords or calf tenderness.   Recent Labs  08/14/15 0130  HGB 14.4  HCT 40.9    Assessment/Plan: Plan for discharge tomorrow   LOS: 1 day   Kalicia Dufresne DARLENE 08/15/2015, 9:17 AM

## 2015-08-16 DIAGNOSIS — O99820 Streptococcus B carrier state complicating pregnancy: Secondary | ICD-10-CM

## 2015-08-16 MED ORDER — IBUPROFEN 600 MG PO TABS: 600.0000 mg | ORAL_TABLET | Freq: Four times a day (QID) | ORAL | Status: DC

## 2015-08-16 MED ORDER — PNEUMOCOCCAL VAC POLYVALENT 25 MCG/0.5ML IJ INJ: 0.5000 mL | INJECTION | INTRAMUSCULAR | Status: DC

## 2015-08-16 MED ORDER — PNEUMOCOCCAL VAC POLYVALENT 25 MCG/0.5ML IJ INJ
0.5000 mL | INJECTION | INTRAMUSCULAR | Status: AC
  Administered 2015-08-16: 0.5 mL via INTRAMUSCULAR
  Filled 2015-08-16: qty 0.5

## 2015-08-16 NOTE — Progress Notes (Signed)
Pt ambulated out teaching complete  Baby in nicu

## 2015-08-16 NOTE — Discharge Summary (Signed)
OB Discharge Summary  Patient Name: Leslie HookHolly L Scott DOB: 01/31/1976 MRN: [id number]  Date of admission: 08/14/2015 Delivering MD: Cam HaiSHAW, KIMBERLY D   Date of discharge: 08/16/2015  Admitting diagnosis: 39wks, CTX, leaking desires sterilization Intrauterine pregnancy: 7549w0d     Secondary diagnosis: narcotic abuse     Discharge diagnosis: Term Pregnancy Delivered                                                                                                Post partum procedures:none  Augmentation: Pitocin  Complications: None  Hospital course:  Onset of Labor With Vaginal Delivery     39 y.o. yo [id number] at 4749w0d was admitted in Active Laboron 08/14/2015. Patient had an uncomplicated labor course as follows:  Membrane Rupture Time/Date:   ,    Intrapartum Procedures: Episiotomy: None [1]                                         Lacerations:  None [1]  Patient had a delivery of a Viable infant. 08/14/2015  Information for the patient's newborn:  Lennart PallScott, Boy Wania [[id number]][[id number]]  Delivery Method: VBAC, Spontaneous (Filed from Delivery Summary)    Pateint had an uncomplicated postpartum course.  She is ambulating, tolerating a regular diet, passing flatus, and urinating well. Patient is discharged home in stable condition on No discharge date for patient encounter.Marland Kitchen.    Physical exam  Filed Vitals:   08/15/15 0525 08/15/15 1157 08/15/15 2117 08/16/15 0558  BP: 118/55 114/63 123/70 118/50  Pulse: 48 58 94 70  Temp: 98.3 F (36.8 C) 98.6 F (37 C) 97.5 F (36.4 C) 98.4 F (36.9 C)  TempSrc: Oral Oral Oral Oral  Resp: 16 16 20 20   Height:      Weight:      SpO2: 95% 98% 96% 100%   General: alert, cooperative and no distress Lochia: appropriate Uterine Fundus: firm Incision: N/A DVT Evaluation: No evidence of DVT seen on physical exam. Negative Homan's sign. No cords or calf tenderness. Labs: Lab Results  Component Value Date   WBC 19.3* 08/14/2015   HGB 14.4  08/14/2015   HCT 40.9 08/14/2015   MCV 91.9 08/14/2015   PLT 181 08/14/2015   CMP Latest Ref Rng 03/04/2015  Glucose 70 - 99 mg/dL 94  BUN 6 - 20 mg/dL 7  Creatinine 7.840.44 - 6.961.00 mg/dL 2.950.54  Sodium 284135 - 132145 mmol/L 135  Potassium 3.5 - 5.1 mmol/L 3.3(L)  Chloride 101 - 111 mmol/L 105  CO2 22 - 32 mmol/L 22  Calcium 8.9 - 10.3 mg/dL 4.4(W8.6(L)  Total Protein 6.5 - 8.1 g/dL 6.3(L)  Total Bilirubin 0.3 - 1.2 mg/dL 0.5  Alkaline Phos 38 - 126 U/L 52  AST 15 - 41 U/L 13(L)  ALT 14 - 54 U/L 11(L)    Discharge instruction: per After Visit Summary and "Baby and Me Booklet".  Medications:  Current facility-administered medications:  .  acetaminophen (TYLENOL) tablet 650 mg, 650  mg, Oral, Q4H PRN, Arabella Merles, CNM, 650 mg at 08/15/15 2225 .  benzocaine-Menthol (DERMOPLAST) 20-0.5 % topical spray 1 application, 1 application, Topical, PRN, Arabella Merles, CNM .  witch hazel-glycerin (TUCKS) pad 1 application, 1 application, Topical, PRN **AND** dibucaine (NUPERCAINAL) 1 % rectal ointment 1 application, 1 application, Rectal, PRN, Arabella Merles, CNM .  diphenhydrAMINE (BENADRYL) capsule 25 mg, 25 mg, Oral, Q6H PRN, Arabella Merles, CNM .  ibuprofen (ADVIL,MOTRIN) tablet 600 mg, 600 mg, Oral, 4 times per day, Arabella Merles, CNM, 600 mg at 08/14/15 1138 .  lanolin ointment, , Topical, PRN, Arabella Merles, CNM .  ondansetron (ZOFRAN) tablet 4 mg, 4 mg, Oral, Q4H PRN **OR** ondansetron (ZOFRAN) injection 4 mg, 4 mg, Intravenous, Q4H PRN, Arabella Merles, CNM .  pneumococcal 23 valent vaccine (PNU-IMMUNE) injection 0.5 mL, 0.5 mL, Intramuscular, Tomorrow-1000, Christin Bach V, MD, 0.5 mL at 08/15/15 0953 .  prenatal multivitamin tablet 1 tablet, 1 tablet, Oral, Q1200, Arabella Merles, CNM, 1 tablet at 08/15/15 1113 .  senna-docusate (Senokot-S) tablet 2 tablet, 2 tablet, Oral, Q24H, Arabella Merles, CNM, 2 tablet at 08/15/15 2225 .  simethicone (MYLICON) chewable tablet 80 mg, 80 mg, Oral, PRN,  Arabella Merles, CNM .  traMADol (ULTRAM) tablet 50 mg, 50 mg, Oral, Q6H PRN, Arabella Merles, CNM, 50 mg at 08/15/15 2226 .  zolpidem (AMBIEN) tablet 5 mg, 5 mg, Oral, QHS PRN, Arabella Merles, CNM  Diet: routine diet  Activity: Advance as tolerated. Pelvic rest for 6 weeks.   Outpatient follow up:2 weeks  Postpartum contraception: Tubal Ligation  Newborn Data: Live born female  Birth Weight: 5 lb 2.5 oz (2340 g) APGAR: 8, 9  Baby Feeding: Bottle Disposition:NICU   08/16/2015 Genaro Bekker, Gershon Mussel, CNM
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
## 2015-08-18 ENCOUNTER — Telehealth: Payer: Self-pay | Admitting: Women's Health

## 2015-08-18 NOTE — Telephone Encounter (Signed)
Pt states that she is having really bad anxiety. Medication is not working. Pt was given an appointment for tomorrow.

## 2015-08-19 ENCOUNTER — Ambulatory Visit (INDEPENDENT_AMBULATORY_CARE_PROVIDER_SITE_OTHER): Payer: Medicaid Other | Admitting: Obstetrics and Gynecology

## 2015-08-19 ENCOUNTER — Encounter: Payer: Self-pay | Admitting: Obstetrics and Gynecology

## 2015-08-19 VITALS — BP 120/80 | Ht 64.0 in | Wt 105.0 lb

## 2015-08-19 DIAGNOSIS — F419 Anxiety disorder, unspecified: Secondary | ICD-10-CM | POA: Diagnosis not present

## 2015-08-19 MED ORDER — TRAMADOL HCL 50 MG PO TABS: 50.0000 mg | ORAL_TABLET | Freq: Four times a day (QID) | ORAL | Status: DC | PRN

## 2015-08-19 MED ORDER — LEVETIRACETAM 500 MG PO TABS: 500.0000 mg | ORAL_TABLET | Freq: Two times a day (BID) | ORAL | Status: DC

## 2015-08-19 NOTE — Progress Notes (Signed)
[facility] Visit  Patient name: Leslie Christian MRN [id number]  Date of birth: 1975-12-18  CC & HPI:  Leslie Christian is a 39 y.o. female S/P Cesarean section 5 days ago, presenting today for increased anxiety since her C-section. Patient reports her anxiety has not been controlled with the Lexapro 10 mg. She reports taking her Lexapro has been causing seizures, which she describes as episodes of shaking and a sensation of chest heaviness.  Patient had used Keppra in the past.  She reports she has not been breast-feeding her baby; rather, he is receiving a high-caloric diet through formula. Patient also requests a refill of Tramadol.   ROS:  A complete 10 system review of systems was obtained and all systems are negative except as noted in the HPI and PMH.    Pertinent History Reviewed:   Reviewed: Significant for anxiety, polysubstance abuse, Cesarean section Medical         Past Medical History  Diagnosis Date   Asthma    COPD (chronic obstructive pulmonary disease) (HCC)    Atrial fibrillation (HCC)    Mitral valve prolapse    Chronic back pain    Chronic neck pain    Migraine headache    Anxiety    Polysubstance abuse     Hx of cocaine, opiates, marijuana use   Seizures (HCC)     Last seizure 9/16                              Surgical Hx:    Past Surgical History  Procedure Laterality Date   Cesarean section     Endoscopy     Cesarean section N/A 07/09/2013    Procedure: CESAREAN SECTION;  Surgeon: Reva Bores, MD;  Location: [facility];  Service: Obstetrics;  Laterality: N/A;   Examination under anesthesia N/A 07/20/2013    Procedure: EXAM UNDER ANESTHESIA / Repair of traumatic dehisence of cesarean incision..;  Surgeon: Allie Bossier, MD;  Location: [facility];  Service: Gynecology;  Laterality: N/A;   Repair traumatic dehisence of cesarean section     Medications: Reviewed & Updated - see associated section                       Current outpatient  prescriptions:    acetaminophen (TYLENOL) 500 MG tablet, Take 1,000 mg by mouth every 6 (six) hours as needed for moderate pain., Disp: , Rfl:    albuterol (PROVENTIL HFA;VENTOLIN HFA) 108 (90 BASE) MCG/ACT inhaler, Inhale 1-2 puffs into the lungs every 4 (four) hours as needed for wheezing or shortness of breath. (Patient taking differently: Inhale 2 puffs into the lungs every 6 (six) hours as needed for wheezing or shortness of breath. ), Disp: 1 Inhaler, Rfl: 3   escitalopram (LEXAPRO) 10 MG tablet, Take 1 tablet (10 mg total) by mouth daily., Disp: 30 tablet, Rfl: 3   ibuprofen (ADVIL,MOTRIN) 600 MG tablet, Take 1 tablet (600 mg total) by mouth every 6 (six) hours., Disp: 30 tablet, Rfl: 0   mometasone-formoterol (DULERA) 100-5 MCG/ACT AERO, Inhale 2 puffs into the lungs 2 (two) times daily., Disp: , Rfl:    Prenatal Vit-Fe Fumarate-FA (PRENATAL MULTIVITAMIN) TABS tablet, Take 1 tablet by mouth daily at 12 noon., Disp: , Rfl:    traMADol (ULTRAM) 50 MG tablet, Take 1 tablet (50 mg total) by mouth every 6 (six) hours as needed for moderate  pain., Disp: 30 tablet, Rfl: 0   Social History: Reviewed -  reports that she has been smoking Cigarettes.  She has a 12 pack-year smoking history. She has never used smokeless tobacco.  Objective Findings:  Vitals: Blood pressure 120/80, height 5\' 4"  (1.626 m), weight 105 lb (47.628 kg), currently breastfeeding.  Physical Examination: Pt here for discussion only.    Assessment & Plan:   A:  1. Exacerbation of chronic anxiety, s/p C-section 5 days ago.  P:  1. Refill on Rx Lexapro. 2. Resume Keppra 500 bid 2. F/u in 3 weeks for postpartum visit and pre-op for BTL.     By signing my name below, I, Ronney LionSuzanne Le, attest that this documentation has been prepared under the direction and in the presence of Tilda BurrowJohn Ferguson V, MD. Electronically Signed: Ronney LionSuzanne Le, ED Scribe. 08/19/2015. 12:14 PM.   I personally performed the services described in  this documentation, which was SCRIBED in my presence. The recorded information has been reviewed and considered accurate. It has been edited as necessary during review. Tilda BurrowFERGUSON,JOHN V, MD

## 2015-08-19 NOTE — Progress Notes (Signed)
Patient ID: Leslie Christian, female   DOB: 10/08/1976, 39 y.o.   MRN: [id number] Pt here today for increased anxiety. Pt states that medication doesn't seem to help.

## 2015-08-20 ENCOUNTER — Encounter: Payer: Medicaid Other | Admitting: Obstetrics and Gynecology

## 2015-08-24 ENCOUNTER — Encounter (INDEPENDENT_AMBULATORY_CARE_PROVIDER_SITE_OTHER): Payer: Self-pay

## 2015-09-14 ENCOUNTER — Ambulatory Visit (INDEPENDENT_AMBULATORY_CARE_PROVIDER_SITE_OTHER): Payer: Medicaid Other | Admitting: Obstetrics and Gynecology

## 2015-09-14 ENCOUNTER — Encounter: Payer: Self-pay | Admitting: Obstetrics and Gynecology

## 2015-09-14 MED ORDER — TRAMADOL HCL 50 MG PO TABS: 50.0000 mg | ORAL_TABLET | Freq: Four times a day (QID) | ORAL | Status: DC | PRN

## 2015-09-14 MED ORDER — NORELGESTROMIN-ETH ESTRADIOL 150-35 MCG/24HR TD PTWK: 1.0000 | MEDICATED_PATCH | TRANSDERMAL | Status: DC

## 2015-09-14 NOTE — Progress Notes (Signed)
Patient ID: Leslie HookHolly L Scott, female   DOB: 05/23/1976, 39 y.o.   MRN: [id number] Pt here today for postpartum visit. Pt states that she has changed her mind on doing the tubal right now. Pt states that she wants to get back on the patch.

## 2015-09-14 NOTE — Progress Notes (Signed)
Patient ID: Leslie Christian, female   DOB: 08/02/1976, 39 y.o.   MRN: [id number] Leslie Christian is a 39 y.o. who presents for a postpartum visit. She is 4 weeks postpartum following a spontaneous vaginal delivery. I have fully reviewed the prenatal and intrapartum course. The delivery was at 39 gestational weeks.  Anesthesia: none. Postpartum course has been stable, with pt on Keppra and lexapro.. Baby's course has been notable for Tx for GBS as pt delivered rapidly  Baby is feeding by bottle. Bleeding: thin lochia. Bowel function is normal. Bladder function is normal. Patient is sexually active. Contraception method is condoms. Postpartum depression screening: negative.sciore 2/    @ROSSHORT @  Objective:     Filed Vitals:   09/14/15 1340  BP: 100/60   General:  alert, cooperative and no distress   Breasts:  negative  Lungs: clear to auscultation bilaterally  Heart:  regular rate and rhythm  Abdomen: Soft, nontender   Vulva:  normal  Vagina: normal vagina lochia moderate  Cervix:  closed  Corpus: Well involuted     Rectal Exam:  hemorrhoids        Assessment:    normal postpartum exam.  Plan:    1. Contraception: Ortho-Evra patches weekly 2. Follow up in: 3 weeks or as needed.  3. Pt to call within 2 weeks if she wants BtL done within the month

## 2015-09-16 ENCOUNTER — Other Ambulatory Visit: Payer: Self-pay | Admitting: Obstetrics and Gynecology

## 2015-09-16 NOTE — Progress Notes (Signed)
[id number] is billed if [facility] does not deliver the patient  [id number] is used if we do

## 2015-10-05 ENCOUNTER — Ambulatory Visit: Payer: Medicaid Other | Admitting: Obstetrics and Gynecology

## 2015-10-05 ENCOUNTER — Encounter: Payer: Self-pay | Admitting: *Deleted

## 2016-09-25 ENCOUNTER — Emergency Department (HOSPITAL_COMMUNITY)
Admission: EM | Admit: 2016-09-25 | Discharge: 2016-09-25 | Disposition: A | Discharge status: Home / Self Care | Payer: Self-pay | Attending: Emergency Medicine | Admitting: Emergency Medicine

## 2016-09-25 ENCOUNTER — Encounter (HOSPITAL_COMMUNITY): Payer: Self-pay | Admitting: Emergency Medicine

## 2016-09-25 DIAGNOSIS — Z91040 Latex allergy status: Secondary | ICD-10-CM | POA: Insufficient documentation

## 2016-09-25 DIAGNOSIS — N12 Tubulo-interstitial nephritis, not specified as acute or chronic: Secondary | ICD-10-CM | POA: Insufficient documentation

## 2016-09-25 DIAGNOSIS — J45909 Unspecified asthma, uncomplicated: Secondary | ICD-10-CM | POA: Insufficient documentation

## 2016-09-25 DIAGNOSIS — F17210 Nicotine dependence, cigarettes, uncomplicated: Secondary | ICD-10-CM | POA: Insufficient documentation

## 2016-09-25 DIAGNOSIS — J449 Chronic obstructive pulmonary disease, unspecified: Secondary | ICD-10-CM | POA: Insufficient documentation

## 2016-09-25 DIAGNOSIS — Z79899 Other long term (current) drug therapy: Secondary | ICD-10-CM | POA: Insufficient documentation

## 2016-09-25 LAB — CBC WITH DIFFERENTIAL/PLATELET
BASOS ABS: 0 10*3/uL (ref 0.0–0.1)
Basophils Relative: 0 %
EOS PCT: 0 %
Eosinophils Absolute: 0 10*3/uL (ref 0.0–0.7)
HEMATOCRIT: 42.8 % (ref 36.0–46.0)
Hemoglobin: 14.6 g/dL (ref 12.0–15.0)
LYMPHS ABS: 1.5 10*3/uL (ref 0.7–4.0)
LYMPHS PCT: 12 %
MCH: 34 pg (ref 26.0–34.0)
MCHC: 34.1 g/dL (ref 30.0–36.0)
MCV: 99.5 fL (ref 78.0–100.0)
MONO ABS: 1.3 10*3/uL — AB (ref 0.1–1.0)
MONOS PCT: 10 %
NEUTROS ABS: 10 10*3/uL — AB (ref 1.7–7.7)
Neutrophils Relative %: 78 %
PLATELETS: 179 10*3/uL (ref 150–400)
RBC: 4.3 MIL/uL (ref 3.87–5.11)
RDW: 14.4 % (ref 11.5–15.5)
WBC: 12.8 10*3/uL — ABNORMAL HIGH (ref 4.0–10.5)

## 2016-09-25 LAB — COMPREHENSIVE METABOLIC PANEL
ALT: 16 U/L (ref 14–54)
AST: 25 U/L (ref 15–41)
Albumin: 4.5 g/dL (ref 3.5–5.0)
Alkaline Phosphatase: 95 U/L (ref 38–126)
Anion gap: 11 (ref 5–15)
BILIRUBIN TOTAL: 0.9 mg/dL (ref 0.3–1.2)
BUN: 9 mg/dL (ref 6–20)
CO2: 24 mmol/L (ref 22–32)
CREATININE: 0.7 mg/dL (ref 0.44–1.00)
Calcium: 9.4 mg/dL (ref 8.9–10.3)
Chloride: 101 mmol/L (ref 101–111)
Glucose, Bld: 90 mg/dL (ref 65–99)
POTASSIUM: 3.8 mmol/L (ref 3.5–5.1)
Sodium: 136 mmol/L (ref 135–145)
TOTAL PROTEIN: 7.7 g/dL (ref 6.5–8.1)

## 2016-09-25 LAB — URINALYSIS, ROUTINE W REFLEX MICROSCOPIC
Bilirubin Urine: NEGATIVE
GLUCOSE, UA: NEGATIVE mg/dL
Ketones, ur: 15 mg/dL — AB
Nitrite: POSITIVE — AB
PH: 5.5 (ref 5.0–8.0)
Specific Gravity, Urine: 1.015 (ref 1.005–1.030)

## 2016-09-25 LAB — URINE MICROSCOPIC-ADD ON

## 2016-09-25 LAB — HCG, QUANTITATIVE, PREGNANCY

## 2016-09-25 MED ORDER — KETOROLAC TROMETHAMINE 30 MG/ML IJ SOLN
30.0000 mg | Freq: Once | INTRAMUSCULAR | Status: AC
  Administered 2016-09-25: 30 mg via INTRAVENOUS
  Filled 2016-09-25: qty 1

## 2016-09-25 MED ORDER — HYDROCODONE-ACETAMINOPHEN 5-325 MG PO TABS
2.0000 | ORAL_TABLET | Freq: Once | ORAL | Status: AC
  Administered 2016-09-25: 2 via ORAL
  Filled 2016-09-25: qty 2

## 2016-09-25 MED ORDER — SODIUM CHLORIDE 0.9 % IV BOLUS (SEPSIS)
1000.0000 mL | Freq: Once | INTRAVENOUS | Status: AC
  Administered 2016-09-25: 1000 mL via INTRAVENOUS

## 2016-09-25 MED ORDER — IBUPROFEN 800 MG PO TABS: 800.0000 mg | ORAL_TABLET | Freq: Three times a day (TID) | ORAL | 0 refills | Status: DC

## 2016-09-25 MED ORDER — ONDANSETRON 4 MG PO TBDP: 4.0000 mg | ORAL_TABLET | Freq: Three times a day (TID) | ORAL | 0 refills | Status: DC | PRN

## 2016-09-25 MED ORDER — CEPHALEXIN 500 MG PO CAPS: 500.0000 mg | ORAL_CAPSULE | Freq: Four times a day (QID) | ORAL | 0 refills | Status: DC

## 2016-09-25 MED ORDER — ONDANSETRON HCL 4 MG/2ML IJ SOLN
4.0000 mg | Freq: Once | INTRAMUSCULAR | Status: AC
  Administered 2016-09-25: 4 mg via INTRAVENOUS
  Filled 2016-09-25: qty 2

## 2016-09-25 MED ORDER — DEXTROSE 5 % IV SOLN
1.0000 g | Freq: Once | INTRAVENOUS | Status: AC
  Administered 2016-09-25: 1 g via INTRAVENOUS
  Filled 2016-09-25: qty 10

## 2016-09-25 NOTE — ED Triage Notes (Signed)
Pt with pain in her R flank and RUQ. Pain started yesterday evening. Pain is much worse today. Pt states she has had some urinary frequency and "feeling like pins." Pt vomiting and unable to hold liquids x 24 hours.

## 2016-09-25 NOTE — ED Triage Notes (Signed)
Pt reports R flank pain that started yesterday. Pt states she has had urinary frequency and urgency. Pt has n/v.

## 2016-09-25 NOTE — Discharge Instructions (Signed)
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
## 2016-09-25 NOTE — ED Triage Notes (Signed)
Pt brought back from waiting area to recheck vital signs. Pt reports increasing pain.

## 2016-09-28 LAB — URINE CULTURE

## 2016-12-24 ENCOUNTER — Emergency Department (HOSPITAL_COMMUNITY)
Admission: EM | Admit: 2016-12-24 | Discharge: 2016-12-24 | Disposition: A | Discharge status: Home / Self Care | Payer: Self-pay | Attending: Emergency Medicine | Admitting: Emergency Medicine

## 2016-12-24 ENCOUNTER — Encounter (HOSPITAL_COMMUNITY): Payer: Self-pay | Admitting: Emergency Medicine

## 2016-12-24 ENCOUNTER — Emergency Department (HOSPITAL_COMMUNITY): Payer: Self-pay

## 2016-12-24 DIAGNOSIS — Y929 Unspecified place or not applicable: Secondary | ICD-10-CM | POA: Insufficient documentation

## 2016-12-24 DIAGNOSIS — S8012XA Contusion of left lower leg, initial encounter: Secondary | ICD-10-CM | POA: Insufficient documentation

## 2016-12-24 DIAGNOSIS — J45909 Unspecified asthma, uncomplicated: Secondary | ICD-10-CM | POA: Insufficient documentation

## 2016-12-24 DIAGNOSIS — S0012XA Contusion of left eyelid and periocular area, initial encounter: Secondary | ICD-10-CM

## 2016-12-24 DIAGNOSIS — W500XXA Accidental hit or strike by another person, initial encounter: Secondary | ICD-10-CM | POA: Insufficient documentation

## 2016-12-24 DIAGNOSIS — Y999 Unspecified external cause status: Secondary | ICD-10-CM | POA: Insufficient documentation

## 2016-12-24 DIAGNOSIS — J449 Chronic obstructive pulmonary disease, unspecified: Secondary | ICD-10-CM | POA: Insufficient documentation

## 2016-12-24 DIAGNOSIS — Y9371 Activity, boxing: Secondary | ICD-10-CM | POA: Insufficient documentation

## 2016-12-24 DIAGNOSIS — S0512XA Contusion of eyeball and orbital tissues, left eye, initial encounter: Secondary | ICD-10-CM | POA: Insufficient documentation

## 2016-12-24 DIAGNOSIS — F17210 Nicotine dependence, cigarettes, uncomplicated: Secondary | ICD-10-CM | POA: Insufficient documentation

## 2016-12-24 MED ORDER — DICLOFENAC SODIUM 75 MG PO TBEC: 75.0000 mg | DELAYED_RELEASE_TABLET | Freq: Two times a day (BID) | ORAL | 0 refills | Status: DC

## 2016-12-24 MED ORDER — TRAMADOL HCL 50 MG PO TABS: 50.0000 mg | ORAL_TABLET | Freq: Four times a day (QID) | ORAL | 0 refills | Status: DC | PRN

## 2016-12-24 NOTE — ED Triage Notes (Addendum)
Kick boxing with a friend and got hit in the LEFT foot ankle area now with pain several days later- bruising and swelling present- pt ambulates with a limb No physician no ortho

## 2016-12-24 NOTE — Discharge Instructions (Signed)
Continue using ice and elevate your leg as much as possible for the next several days which will help with pain and swelling.  You may start using a heating pad also starting tomorrow - 15 minutes several times daily.   Your xrays are ok today.

## 2016-12-24 NOTE — ED Notes (Signed)
Patient transported to X-ray 
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
## 2017-02-24 ENCOUNTER — Encounter (HOSPITAL_COMMUNITY): Payer: Self-pay | Admitting: *Deleted

## 2017-02-24 ENCOUNTER — Emergency Department (HOSPITAL_COMMUNITY): Payer: Self-pay

## 2017-02-24 ENCOUNTER — Emergency Department (HOSPITAL_COMMUNITY)
Admission: EM | Admit: 2017-02-24 | Discharge: 2017-02-24 | Disposition: A | Discharge status: Home / Self Care | Payer: Self-pay | Attending: Emergency Medicine | Admitting: Emergency Medicine

## 2017-02-24 DIAGNOSIS — J45909 Unspecified asthma, uncomplicated: Secondary | ICD-10-CM | POA: Insufficient documentation

## 2017-02-24 DIAGNOSIS — Y929 Unspecified place or not applicable: Secondary | ICD-10-CM | POA: Insufficient documentation

## 2017-02-24 DIAGNOSIS — Y999 Unspecified external cause status: Secondary | ICD-10-CM | POA: Insufficient documentation

## 2017-02-24 DIAGNOSIS — Y9389 Activity, other specified: Secondary | ICD-10-CM | POA: Insufficient documentation

## 2017-02-24 DIAGNOSIS — F17210 Nicotine dependence, cigarettes, uncomplicated: Secondary | ICD-10-CM | POA: Insufficient documentation

## 2017-02-24 DIAGNOSIS — S60222A Contusion of left hand, initial encounter: Secondary | ICD-10-CM | POA: Insufficient documentation

## 2017-02-24 DIAGNOSIS — R51 Headache: Secondary | ICD-10-CM | POA: Insufficient documentation

## 2017-02-24 DIAGNOSIS — T07XXXA Unspecified multiple injuries, initial encounter: Secondary | ICD-10-CM

## 2017-02-24 DIAGNOSIS — J449 Chronic obstructive pulmonary disease, unspecified: Secondary | ICD-10-CM | POA: Insufficient documentation

## 2017-02-24 DIAGNOSIS — Z79899 Other long term (current) drug therapy: Secondary | ICD-10-CM | POA: Insufficient documentation

## 2017-02-24 LAB — I-STAT BETA HCG BLOOD, ED (MC, WL, AP ONLY)

## 2017-02-24 MED ORDER — MORPHINE SULFATE (PF) 4 MG/ML IV SOLN
4.0000 mg | Freq: Once | INTRAVENOUS | Status: AC
  Administered 2017-02-24: 4 mg via INTRAMUSCULAR
  Filled 2017-02-24: qty 1

## 2017-02-24 MED ORDER — LORAZEPAM 1 MG PO TABS
1.0000 mg | ORAL_TABLET | Freq: Once | ORAL | Status: AC
  Administered 2017-02-24: 1 mg via ORAL
  Filled 2017-02-24: qty 1

## 2017-02-24 MED ORDER — HYDROCODONE-ACETAMINOPHEN 5-325 MG PO TABS: 1.0000 | ORAL_TABLET | ORAL | 0 refills | Status: DC | PRN

## 2017-02-24 MED ORDER — IBUPROFEN 600 MG PO TABS: 600.0000 mg | ORAL_TABLET | Freq: Four times a day (QID) | ORAL | 0 refills | Status: DC | PRN

## 2017-02-24 MED ORDER — ONDANSETRON 8 MG PO TBDP
8.0000 mg | ORAL_TABLET | Freq: Once | ORAL | Status: AC
  Administered 2017-02-24: 8 mg via ORAL
  Filled 2017-02-24: qty 1
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
## 2017-02-24 NOTE — ED Notes (Signed)
Pt waiting to hear back from mom.
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
## 2017-02-24 NOTE — ED Notes (Signed)
Pt ambulatory without difficulty. Leaving.  ER physician stopped pt.
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
## 2017-04-30 ENCOUNTER — Emergency Department (HOSPITAL_COMMUNITY)
Admission: EM | Admit: 2017-04-30 | Discharge: 2017-04-30 | Disposition: A | Discharge status: Home / Self Care | Payer: Self-pay | Attending: Emergency Medicine | Admitting: Emergency Medicine

## 2017-04-30 ENCOUNTER — Emergency Department (HOSPITAL_COMMUNITY): Payer: Self-pay

## 2017-04-30 ENCOUNTER — Encounter (HOSPITAL_COMMUNITY): Payer: Self-pay | Admitting: *Deleted

## 2017-04-30 DIAGNOSIS — S0990XA Unspecified injury of head, initial encounter: Secondary | ICD-10-CM

## 2017-04-30 DIAGNOSIS — J45909 Unspecified asthma, uncomplicated: Secondary | ICD-10-CM | POA: Insufficient documentation

## 2017-04-30 DIAGNOSIS — S01112A Laceration without foreign body of left eyelid and periocular area, initial encounter: Secondary | ICD-10-CM | POA: Insufficient documentation

## 2017-04-30 DIAGNOSIS — Y9389 Activity, other specified: Secondary | ICD-10-CM | POA: Insufficient documentation

## 2017-04-30 DIAGNOSIS — Y929 Unspecified place or not applicable: Secondary | ICD-10-CM | POA: Insufficient documentation

## 2017-04-30 DIAGNOSIS — G8929 Other chronic pain: Secondary | ICD-10-CM | POA: Insufficient documentation

## 2017-04-30 DIAGNOSIS — S022XXA Fracture of nasal bones, initial encounter for closed fracture: Secondary | ICD-10-CM | POA: Insufficient documentation

## 2017-04-30 DIAGNOSIS — Z91040 Latex allergy status: Secondary | ICD-10-CM | POA: Insufficient documentation

## 2017-04-30 DIAGNOSIS — S20211A Contusion of right front wall of thorax, initial encounter: Secondary | ICD-10-CM | POA: Insufficient documentation

## 2017-04-30 DIAGNOSIS — J449 Chronic obstructive pulmonary disease, unspecified: Secondary | ICD-10-CM | POA: Insufficient documentation

## 2017-04-30 DIAGNOSIS — Y999 Unspecified external cause status: Secondary | ICD-10-CM | POA: Insufficient documentation

## 2017-04-30 DIAGNOSIS — F17210 Nicotine dependence, cigarettes, uncomplicated: Secondary | ICD-10-CM | POA: Insufficient documentation

## 2017-04-30 DIAGNOSIS — S0181XA Laceration without foreign body of other part of head, initial encounter: Secondary | ICD-10-CM

## 2017-04-30 MED ORDER — ALPRAZOLAM 0.5 MG PO TABS
0.5000 mg | ORAL_TABLET | Freq: Once | ORAL | Status: AC
  Administered 2017-04-30: 0.5 mg via ORAL
  Filled 2017-04-30: qty 1

## 2017-04-30 MED ORDER — FLUORESCEIN SODIUM 0.6 MG OP STRP
1.0000 | ORAL_STRIP | Freq: Once | OPHTHALMIC | Status: AC
  Administered 2017-04-30: 1 via OPHTHALMIC
  Filled 2017-04-30: qty 1

## 2017-04-30 MED ORDER — OXYCODONE-ACETAMINOPHEN 5-325 MG PO TABS
2.0000 | ORAL_TABLET | Freq: Once | ORAL | Status: AC
  Administered 2017-04-30: 2 via ORAL
  Filled 2017-04-30: qty 2

## 2017-04-30 MED ORDER — HYDROCODONE-ACETAMINOPHEN 5-325 MG PO TABS: 1.0000 | ORAL_TABLET | ORAL | 0 refills | Status: DC | PRN

## 2017-04-30 MED ORDER — TETRACAINE HCL 0.5 % OP SOLN
1.0000 [drp] | Freq: Once | OPHTHALMIC | Status: AC
  Administered 2017-04-30: 1 [drp] via OPHTHALMIC
  Filled 2017-04-30: qty 4

## 2017-04-30 MED ORDER — IBUPROFEN 800 MG PO TABS
800.0000 mg | ORAL_TABLET | Freq: Once | ORAL | Status: AC
  Administered 2017-04-30: 800 mg via ORAL
  Filled 2017-04-30: qty 1

## 2017-04-30 MED ORDER — LIDOCAINE-EPINEPHRINE (PF) 1 %-1:200000 IJ SOLN
10.0000 mL | Freq: Once | INTRAMUSCULAR | Status: AC
  Administered 2017-04-30: 10 mL
  Filled 2017-04-30: qty 30
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
## 2017-04-30 NOTE — Discharge Instructions (Signed)
Return or see your doctor in 5 or 6 days to have sutures removed.
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
## 2017-05-05 ENCOUNTER — Encounter (HOSPITAL_COMMUNITY): Payer: Self-pay | Admitting: *Deleted

## 2017-05-05 ENCOUNTER — Emergency Department (HOSPITAL_COMMUNITY)
Admission: EM | Admit: 2017-05-05 | Discharge: 2017-05-05 | Disposition: A | Discharge status: Home / Self Care | Payer: Self-pay | Attending: Emergency Medicine | Admitting: Emergency Medicine

## 2017-05-05 DIAGNOSIS — F17210 Nicotine dependence, cigarettes, uncomplicated: Secondary | ICD-10-CM | POA: Insufficient documentation

## 2017-05-05 DIAGNOSIS — Z91040 Latex allergy status: Secondary | ICD-10-CM | POA: Insufficient documentation

## 2017-05-05 DIAGNOSIS — J449 Chronic obstructive pulmonary disease, unspecified: Secondary | ICD-10-CM | POA: Insufficient documentation

## 2017-05-05 DIAGNOSIS — Z4802 Encounter for removal of sutures: Secondary | ICD-10-CM | POA: Insufficient documentation

## 2017-05-05 DIAGNOSIS — J45909 Unspecified asthma, uncomplicated: Secondary | ICD-10-CM | POA: Insufficient documentation

## 2017-05-05 MED ORDER — HYDROCODONE-ACETAMINOPHEN 7.5-325 MG PO TABS: 1.0000 | ORAL_TABLET | ORAL | 0 refills | Status: DC | PRN
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
## 2017-05-05 NOTE — Discharge Instructions (Signed)
Vital signs within normal limits. Pulse oximetry is 98% on room air, within normal limits. Your sutures were removed today. You may wash the area with soap and water. And will continue to heal. Please see your primary physician or return to the emergency department if any unusual red streaking, or high fever develops on.  You were given a prescription for narcotic pain medication. Please use this medication with caution. It may cause drowsiness, and/or lightheadedness. Please do not drive a vehicle, operating machinery, drink alcohol, or participate in activities requiring concentration when taking this medication.
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
## 2017-05-15 ENCOUNTER — Emergency Department (HOSPITAL_COMMUNITY)
Admission: EM | Admit: 2017-05-15 | Discharge: 2017-05-15 | Disposition: A | Discharge status: Home / Self Care | Payer: Self-pay | Attending: Emergency Medicine | Admitting: Emergency Medicine

## 2017-05-15 ENCOUNTER — Emergency Department (HOSPITAL_COMMUNITY): Payer: Self-pay

## 2017-05-15 ENCOUNTER — Encounter (HOSPITAL_COMMUNITY): Payer: Self-pay | Admitting: Emergency Medicine

## 2017-05-15 DIAGNOSIS — R079 Chest pain, unspecified: Secondary | ICD-10-CM

## 2017-05-15 DIAGNOSIS — F1410 Cocaine abuse, uncomplicated: Secondary | ICD-10-CM | POA: Insufficient documentation

## 2017-05-15 DIAGNOSIS — J449 Chronic obstructive pulmonary disease, unspecified: Secondary | ICD-10-CM | POA: Insufficient documentation

## 2017-05-15 DIAGNOSIS — J45909 Unspecified asthma, uncomplicated: Secondary | ICD-10-CM | POA: Insufficient documentation

## 2017-05-15 DIAGNOSIS — G8929 Other chronic pain: Secondary | ICD-10-CM | POA: Insufficient documentation

## 2017-05-15 DIAGNOSIS — F17210 Nicotine dependence, cigarettes, uncomplicated: Secondary | ICD-10-CM | POA: Insufficient documentation

## 2017-05-15 LAB — RAPID URINE DRUG SCREEN, HOSP PERFORMED
AMPHETAMINES: NOT DETECTED
Barbiturates: NOT DETECTED
Benzodiazepines: NOT DETECTED
Cocaine: POSITIVE — AB
Opiates: NOT DETECTED
Tetrahydrocannabinol: NOT DETECTED

## 2017-05-15 LAB — I-STAT TROPONIN, ED
TROPONIN I, POC: 0 ng/mL (ref 0.00–0.08)
Troponin i, poc: 0 ng/mL (ref 0.00–0.08)

## 2017-05-15 LAB — BASIC METABOLIC PANEL
Anion gap: 10 (ref 5–15)
BUN: 17 mg/dL (ref 6–20)
CALCIUM: 9 mg/dL (ref 8.9–10.3)
CO2: 23 mmol/L (ref 22–32)
Chloride: 105 mmol/L (ref 101–111)
Creatinine, Ser: 0.71 mg/dL (ref 0.44–1.00)
GFR calc Af Amer: >60 mL/min (ref >60)
GLUCOSE: 101 mg/dL — AB (ref 65–99)
Potassium: 3.6 mmol/L (ref 3.5–5.1)
Sodium: 138 mmol/L (ref 135–145)

## 2017-05-15 LAB — CBC
HCT: 40.2 % (ref 36.0–46.0)
Hemoglobin: 13.8 g/dL (ref 12.0–15.0)
MCH: 33.3 pg (ref 26.0–34.0)
MCHC: 34.3 g/dL (ref 30.0–36.0)
MCV: 96.9 fL (ref 78.0–100.0)
Platelets: 261 10*3/uL (ref 150–400)
RBC: 4.15 MIL/uL (ref 3.87–5.11)
RDW: 13.2 % (ref 11.5–15.5)
WBC: 20.3 10*3/uL — ABNORMAL HIGH (ref 4.0–10.5)

## 2017-05-15 MED ORDER — CYCLOBENZAPRINE HCL 10 MG PO TABS: 10.0000 mg | ORAL_TABLET | Freq: Three times a day (TID) | ORAL | 0 refills | Status: DC | PRN

## 2017-05-15 MED ORDER — TRAMADOL HCL 50 MG PO TABS: 50.0000 mg | ORAL_TABLET | Freq: Four times a day (QID) | ORAL | 0 refills | Status: DC | PRN

## 2017-05-15 MED ORDER — OXYCODONE-ACETAMINOPHEN 5-325 MG PO TABS
1.0000 | ORAL_TABLET | Freq: Once | ORAL | Status: AC
  Administered 2017-05-15: 1 via ORAL
  Filled 2017-05-15: qty 1

## 2017-05-15 NOTE — ED Triage Notes (Signed)
Pt states she woke up with left arm pain and tightness in her chest and is now having SOB

## 2017-05-15 NOTE — ED Notes (Signed)
Pt states understanding of care given and follow up instructions.  Pt a/o ambulated from ED with steady gait 
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
## 2017-06-12 ENCOUNTER — Encounter (HOSPITAL_COMMUNITY): Payer: Self-pay | Admitting: Emergency Medicine

## 2017-06-12 ENCOUNTER — Emergency Department (HOSPITAL_COMMUNITY): Payer: Self-pay

## 2017-06-12 ENCOUNTER — Emergency Department (HOSPITAL_COMMUNITY)
Admission: EM | Admit: 2017-06-12 | Discharge: 2017-06-12 | Disposition: A | Discharge status: Home / Self Care | Payer: Self-pay | Attending: Emergency Medicine | Admitting: Emergency Medicine

## 2017-06-12 DIAGNOSIS — S50312A Abrasion of left elbow, initial encounter: Secondary | ICD-10-CM | POA: Insufficient documentation

## 2017-06-12 DIAGNOSIS — S80211A Abrasion, right knee, initial encounter: Secondary | ICD-10-CM | POA: Insufficient documentation

## 2017-06-12 DIAGNOSIS — Y998 Other external cause status: Secondary | ICD-10-CM | POA: Insufficient documentation

## 2017-06-12 DIAGNOSIS — F17210 Nicotine dependence, cigarettes, uncomplicated: Secondary | ICD-10-CM | POA: Insufficient documentation

## 2017-06-12 DIAGNOSIS — F10920 Alcohol use, unspecified with intoxication, uncomplicated: Secondary | ICD-10-CM | POA: Insufficient documentation

## 2017-06-12 DIAGNOSIS — J45909 Unspecified asthma, uncomplicated: Secondary | ICD-10-CM | POA: Insufficient documentation

## 2017-06-12 DIAGNOSIS — Y33XXXA Other specified events, undetermined intent, initial encounter: Secondary | ICD-10-CM | POA: Insufficient documentation

## 2017-06-12 DIAGNOSIS — Z79899 Other long term (current) drug therapy: Secondary | ICD-10-CM | POA: Insufficient documentation

## 2017-06-12 DIAGNOSIS — S80212A Abrasion, left knee, initial encounter: Secondary | ICD-10-CM | POA: Insufficient documentation

## 2017-06-12 DIAGNOSIS — Y929 Unspecified place or not applicable: Secondary | ICD-10-CM | POA: Insufficient documentation

## 2017-06-12 DIAGNOSIS — Y9389 Activity, other specified: Secondary | ICD-10-CM | POA: Insufficient documentation

## 2017-06-12 DIAGNOSIS — J449 Chronic obstructive pulmonary disease, unspecified: Secondary | ICD-10-CM | POA: Insufficient documentation

## 2017-06-12 LAB — CBC WITH DIFFERENTIAL/PLATELET
Basophils Absolute: 0.1 10*3/uL (ref 0.0–0.1)
Basophils Relative: 1 %
EOS ABS: 0.3 10*3/uL (ref 0.0–0.7)
EOS PCT: 3 %
HCT: 42.4 % (ref 36.0–46.0)
Hemoglobin: 14.6 g/dL (ref 12.0–15.0)
LYMPHS ABS: 4.2 10*3/uL — AB (ref 0.7–4.0)
Lymphocytes Relative: 38 %
MCH: 32.8 pg (ref 26.0–34.0)
MCHC: 34.4 g/dL (ref 30.0–36.0)
MCV: 95.3 fL (ref 78.0–100.0)
MONO ABS: 0.8 10*3/uL (ref 0.1–1.0)
Monocytes Relative: 8 %
NEUTROS PCT: 50 %
Neutro Abs: 5.5 10*3/uL (ref 1.7–7.7)
PLATELETS: 246 10*3/uL (ref 150–400)
RBC: 4.45 MIL/uL (ref 3.87–5.11)
RDW: 13.7 % (ref 11.5–15.5)
WBC: 10.8 10*3/uL — AB (ref 4.0–10.5)

## 2017-06-12 LAB — BASIC METABOLIC PANEL
Anion gap: 10 (ref 5–15)
BUN: 12 mg/dL (ref 6–20)
CALCIUM: 8.3 mg/dL — AB (ref 8.9–10.3)
CO2: 25 mmol/L (ref 22–32)
CREATININE: 0.74 mg/dL (ref 0.44–1.00)
Chloride: 107 mmol/L (ref 101–111)
GFR calc Af Amer: >60 mL/min (ref >60)
GLUCOSE: 98 mg/dL (ref 65–99)
POTASSIUM: 3 mmol/L — AB (ref 3.5–5.1)
SODIUM: 142 mmol/L (ref 135–145)

## 2017-06-12 LAB — URINALYSIS, ROUTINE W REFLEX MICROSCOPIC
Bacteria, UA: NONE SEEN
Bilirubin Urine: NEGATIVE
GLUCOSE, UA: NEGATIVE mg/dL
KETONES UR: NEGATIVE mg/dL
Leukocytes, UA: NEGATIVE
Nitrite: NEGATIVE
PH: 5 (ref 5.0–8.0)
Protein, ur: NEGATIVE mg/dL
Specific Gravity, Urine: 1.006 (ref 1.005–1.030)

## 2017-06-12 LAB — PREGNANCY, URINE: Preg Test, Ur: NEGATIVE

## 2017-06-12 LAB — RAPID URINE DRUG SCREEN, HOSP PERFORMED
Amphetamines: NOT DETECTED
BENZODIAZEPINES: NOT DETECTED
Barbiturates: NOT DETECTED
COCAINE: POSITIVE — AB
OPIATES: NOT DETECTED
TETRAHYDROCANNABINOL: POSITIVE — AB

## 2017-06-12 LAB — ETHANOL: Alcohol, Ethyl (B): 305 mg/dL (ref <5)

## 2017-06-12 MED ORDER — STERILE WATER FOR INJECTION IJ SOLN
INTRAMUSCULAR | Status: AC
  Filled 2017-06-12: qty 10

## 2017-06-12 MED ORDER — SODIUM CHLORIDE 0.9 % IV BOLUS (SEPSIS)
1000.0000 mL | Freq: Once | INTRAVENOUS | Status: AC
  Administered 2017-06-12: 1000 mL via INTRAVENOUS

## 2017-06-12 NOTE — ED Notes (Signed)
Pt refusing EKG

## 2017-06-12 NOTE — ED Notes (Addendum)
Pt ambulatory to restroom with steady gate.
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
## 2017-06-12 NOTE — ED Notes (Signed)
Pt is alert and has a steady gait. Pt discharged to the waiting room.
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
## 2017-06-12 NOTE — ED Notes (Signed)
Date and time results received: 06/12/17 0130 (use smartphrase ".now" to insert current time)  Test: ethanol Critical Value: 305  Name of Provider Notified: dr Wilkie Ayehorton  Orders Received? Or Actions Taken?: Actions Taken: no orders received

## 2017-06-27 ENCOUNTER — Emergency Department (HOSPITAL_COMMUNITY): Payer: No Typology Code available for payment source

## 2017-06-27 ENCOUNTER — Encounter (HOSPITAL_COMMUNITY): Payer: Self-pay

## 2017-06-27 ENCOUNTER — Emergency Department (HOSPITAL_COMMUNITY)
Admission: EM | Admit: 2017-06-27 | Discharge: 2017-06-27 | Disposition: A | Discharge status: Home / Self Care | Payer: No Typology Code available for payment source | Attending: Emergency Medicine | Admitting: Emergency Medicine

## 2017-06-27 DIAGNOSIS — J449 Chronic obstructive pulmonary disease, unspecified: Secondary | ICD-10-CM | POA: Diagnosis not present

## 2017-06-27 DIAGNOSIS — R079 Chest pain, unspecified: Secondary | ICD-10-CM | POA: Diagnosis present

## 2017-06-27 DIAGNOSIS — J45909 Unspecified asthma, uncomplicated: Secondary | ICD-10-CM | POA: Diagnosis not present

## 2017-06-27 DIAGNOSIS — S161XXA Strain of muscle, fascia and tendon at neck level, initial encounter: Secondary | ICD-10-CM | POA: Diagnosis not present

## 2017-06-27 DIAGNOSIS — Z79899 Other long term (current) drug therapy: Secondary | ICD-10-CM | POA: Insufficient documentation

## 2017-06-27 DIAGNOSIS — Y998 Other external cause status: Secondary | ICD-10-CM | POA: Diagnosis not present

## 2017-06-27 DIAGNOSIS — Y929 Unspecified place or not applicable: Secondary | ICD-10-CM | POA: Diagnosis not present

## 2017-06-27 DIAGNOSIS — F17210 Nicotine dependence, cigarettes, uncomplicated: Secondary | ICD-10-CM | POA: Insufficient documentation

## 2017-06-27 DIAGNOSIS — T148XXA Other injury of unspecified body region, initial encounter: Secondary | ICD-10-CM | POA: Diagnosis not present

## 2017-06-27 DIAGNOSIS — Y9389 Activity, other specified: Secondary | ICD-10-CM | POA: Insufficient documentation

## 2017-06-27 NOTE — ED Notes (Signed)
EKG given to Dr.Wentz. C-collar applied to pt.

## 2017-06-27 NOTE — ED Notes (Signed)
Advised pt's of scans. Went into pt's room and visitor brought her food. Told her not to eat until results from scans were back.

## 2017-06-27 NOTE — ED Notes (Signed)
Pt is getting increasingly anxious. Wants anxiety medication and is stating the neck collar is making her claustrophobia worse. Have opened the door to the patient's room and stayed with her until her anxiety has calmed down. Will continue to monitor.

## 2017-06-27 NOTE — Discharge Instructions (Signed)
Use ice on the sore areas 3 times a day for 3 days after that use heat.  For pain, take ibuprofen 3 times a day with meals.
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
## 2017-06-27 NOTE — ED Notes (Signed)
Pt to xray and CT

## 2019-08-26 ENCOUNTER — Other Ambulatory Visit (HOSPITAL_COMMUNITY): Payer: Self-pay | Admitting: Nurse Practitioner

## 2019-08-26 DIAGNOSIS — Z1231 Encounter for screening mammogram for malignant neoplasm of breast: Secondary | ICD-10-CM

## 2019-09-01 ENCOUNTER — Encounter (HOSPITAL_COMMUNITY): Payer: Self-pay

## 2019-09-01 ENCOUNTER — Ambulatory Visit (HOSPITAL_COMMUNITY): Payer: Self-pay

## 2019-09-22 ENCOUNTER — Ambulatory Visit (HOSPITAL_COMMUNITY)
Admission: RE | Admit: 2019-09-22 | Discharge: 2019-09-22 | Disposition: A | Discharge status: Home / Self Care | Payer: Self-pay | Source: Ambulatory Visit | Attending: Nurse Practitioner | Admitting: Nurse Practitioner

## 2019-09-22 ENCOUNTER — Other Ambulatory Visit: Payer: Self-pay

## 2019-09-22 DIAGNOSIS — Z1231 Encounter for screening mammogram for malignant neoplasm of breast: Secondary | ICD-10-CM | POA: Insufficient documentation

## 2019-11-06 ENCOUNTER — Encounter: Payer: Self-pay | Admitting: Obstetrics & Gynecology

## 2020-08-13 ENCOUNTER — Other Ambulatory Visit: Payer: Self-pay

## 2020-08-13 ENCOUNTER — Emergency Department (HOSPITAL_COMMUNITY): Admission: EM | Admit: 2020-08-13 | Discharge: 2020-08-13 | Discharge status: Against Medical Advice | Payer: Self-pay

## 2020-08-16 ENCOUNTER — Other Ambulatory Visit: Payer: Self-pay

## 2020-08-16 ENCOUNTER — Emergency Department (HOSPITAL_COMMUNITY): Payer: Self-pay

## 2020-08-16 ENCOUNTER — Encounter (HOSPITAL_COMMUNITY): Payer: Self-pay | Admitting: Emergency Medicine

## 2020-08-16 ENCOUNTER — Emergency Department (HOSPITAL_COMMUNITY)
Admission: EM | Admit: 2020-08-16 | Discharge: 2020-08-16 | Disposition: A | Discharge status: Home / Self Care | Payer: Self-pay | Attending: Emergency Medicine | Admitting: Emergency Medicine

## 2020-08-16 DIAGNOSIS — R519 Headache, unspecified: Secondary | ICD-10-CM | POA: Insufficient documentation

## 2020-08-16 DIAGNOSIS — J449 Chronic obstructive pulmonary disease, unspecified: Secondary | ICD-10-CM | POA: Insufficient documentation

## 2020-08-16 DIAGNOSIS — S3210XA Unspecified fracture of sacrum, initial encounter for closed fracture: Secondary | ICD-10-CM

## 2020-08-16 DIAGNOSIS — F1010 Alcohol abuse, uncomplicated: Secondary | ICD-10-CM

## 2020-08-16 DIAGNOSIS — F17210 Nicotine dependence, cigarettes, uncomplicated: Secondary | ICD-10-CM | POA: Insufficient documentation

## 2020-08-16 DIAGNOSIS — R0789 Other chest pain: Secondary | ICD-10-CM | POA: Insufficient documentation

## 2020-08-16 DIAGNOSIS — J45909 Unspecified asthma, uncomplicated: Secondary | ICD-10-CM | POA: Insufficient documentation

## 2020-08-16 LAB — COMPREHENSIVE METABOLIC PANEL
ALT: 19 U/L (ref 0–44)
AST: 27 U/L (ref 15–41)
Albumin: 4.1 g/dL (ref 3.5–5.0)
Alkaline Phosphatase: 80 U/L (ref 38–126)
Anion gap: 12 (ref 5–15)
BUN: 11 mg/dL (ref 6–20)
CO2: 24 mmol/L (ref 22–32)
Calcium: 9 mg/dL (ref 8.9–10.3)
Chloride: 103 mmol/L (ref 98–111)
Creatinine, Ser: 0.73 mg/dL (ref 0.44–1.00)
GFR, Estimated: >60 mL/min (ref >60)
Glucose, Bld: 85 mg/dL (ref 70–99)
Potassium: 3.3 mmol/L — ABNORMAL LOW (ref 3.5–5.1)
Sodium: 139 mmol/L (ref 135–145)
Total Bilirubin: 0.4 mg/dL (ref 0.3–1.2)
Total Protein: 7.4 g/dL (ref 6.5–8.1)

## 2020-08-16 LAB — CBC WITH DIFFERENTIAL/PLATELET
Abs Immature Granulocytes: 0.03 10*3/uL (ref 0.00–0.07)
Basophils Absolute: 0.1 10*3/uL (ref 0.0–0.1)
Basophils Relative: 1 %
Eosinophils Absolute: 0.1 10*3/uL (ref 0.0–0.5)
Eosinophils Relative: 1 %
HCT: 46.4 % — ABNORMAL HIGH (ref 36.0–46.0)
Hemoglobin: 15.2 g/dL — ABNORMAL HIGH (ref 12.0–15.0)
Immature Granulocytes: 0 %
Lymphocytes Relative: 28 %
Lymphs Abs: 3.9 10*3/uL (ref 0.7–4.0)
MCH: 29.9 pg (ref 26.0–34.0)
MCHC: 32.8 g/dL (ref 30.0–36.0)
MCV: 91.2 fL (ref 80.0–100.0)
Monocytes Absolute: 1 10*3/uL (ref 0.1–1.0)
Monocytes Relative: 7 %
Neutro Abs: 8.9 10*3/uL — ABNORMAL HIGH (ref 1.7–7.7)
Neutrophils Relative %: 63 %
Platelets: 279 10*3/uL (ref 150–400)
RBC: 5.09 MIL/uL (ref 3.87–5.11)
RDW: 14.2 % (ref 11.5–15.5)
WBC: 14 10*3/uL — ABNORMAL HIGH (ref 4.0–10.5)
nRBC: 0 % (ref 0.0–0.2)

## 2020-08-16 LAB — I-STAT BETA HCG BLOOD, ED (MC, WL, AP ONLY): I-stat hCG, quantitative: <5 m[IU]/mL (ref <5)

## 2020-08-16 LAB — ETHANOL: Alcohol, Ethyl (B): 122 mg/dL — ABNORMAL HIGH (ref <10)

## 2020-08-16 MED ORDER — SODIUM CHLORIDE 0.9 % IV BOLUS: 1000.0000 mL | Freq: Once | INTRAVENOUS | Status: DC

## 2020-08-16 MED ORDER — IOHEXOL 300 MG/ML  SOLN
75.0000 mL | Freq: Once | INTRAMUSCULAR | Status: AC | PRN
  Administered 2020-08-16: 75 mL via INTRAVENOUS

## 2020-08-16 MED ORDER — LACTATED RINGERS IV BOLUS
2000.0000 mL | Freq: Once | INTRAVENOUS | Status: AC
  Administered 2020-08-16: 2000 mL via INTRAVENOUS

## 2020-08-16 MED ORDER — IOHEXOL 300 MG/ML  SOLN: 100.0000 mL | Freq: Once | INTRAMUSCULAR | Status: DC | PRN

## 2020-08-16 MED ORDER — LORAZEPAM 2 MG/ML IJ SOLN
1.0000 mg | Freq: Once | INTRAMUSCULAR | Status: AC
  Administered 2020-08-16: 1 mg via INTRAVENOUS
  Filled 2020-08-16: qty 1
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
## 2020-08-16 NOTE — ED Notes (Signed)
Unable to get pt changed into gown

## 2020-08-16 NOTE — ED Notes (Signed)
Pt ambulated in hallway with stand by nursing assist. Pt crying while ambulating.
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
## 2020-08-16 NOTE — ED Notes (Signed)
Pt unable to stay still, unable to get accurate bp on pt

## 2020-08-16 NOTE — Discharge Instructions (Addendum)
Use circular pillow to take pressure off the sacrum. Tylenol and motrin for for pain every 6 hrs, ice as needed. Stay well hydrated.  Avoid alcohol intake.

## 2021-11-22 ENCOUNTER — Emergency Department (HOSPITAL_COMMUNITY): Payer: Self-pay

## 2021-11-22 ENCOUNTER — Other Ambulatory Visit: Payer: Self-pay

## 2021-11-22 ENCOUNTER — Emergency Department (HOSPITAL_COMMUNITY)
Admission: EM | Admit: 2021-11-22 | Discharge: 2021-11-22 | Payer: Self-pay | Attending: Emergency Medicine | Admitting: Emergency Medicine

## 2021-11-22 DIAGNOSIS — Z7951 Long term (current) use of inhaled steroids: Secondary | ICD-10-CM | POA: Insufficient documentation

## 2021-11-22 DIAGNOSIS — G43909 Migraine, unspecified, not intractable, without status migrainosus: Secondary | ICD-10-CM | POA: Insufficient documentation

## 2021-11-22 DIAGNOSIS — S52611C Displaced fracture of right ulna styloid process, initial encounter for open fracture type IIIA, IIIB, or IIIC: Secondary | ICD-10-CM | POA: Insufficient documentation

## 2021-11-22 DIAGNOSIS — J45909 Unspecified asthma, uncomplicated: Secondary | ICD-10-CM | POA: Insufficient documentation

## 2021-11-22 DIAGNOSIS — Z91040 Latex allergy status: Secondary | ICD-10-CM | POA: Insufficient documentation

## 2021-11-22 MED ORDER — OXYCODONE-ACETAMINOPHEN 5-325 MG PO TABS
1.0000 | ORAL_TABLET | Freq: Once | ORAL | Status: AC
  Administered 2021-11-22: 13:00:00 1 via ORAL
  Filled 2021-11-22: qty 1

## 2021-11-22 MED ORDER — HYDROCODONE-ACETAMINOPHEN 5-325 MG PO TABS
1.0000 | ORAL_TABLET | Freq: Once | ORAL | Status: AC
  Administered 2021-11-22: 07:00:00 1 via ORAL
  Filled 2021-11-22: qty 1

## 2021-11-22 MED ORDER — OXYCODONE-ACETAMINOPHEN 5-325 MG PO TABS: 1.0000 | ORAL_TABLET | Freq: Four times a day (QID) | ORAL | 0 refills | Status: AC | PRN
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
## 2021-11-22 NOTE — Progress Notes (Signed)
Orthopedic Tech Progress Note Patient Details:  Leslie Christian 1976/08/14 [id number]  Ortho Devices Type of Ortho Device: Arm sling, Sugartong splint, Cotton web roll Ortho Device/Splint Location: RUE Ortho Device/Splint Interventions: Ordered, Application, Adjustment   Post Interventions Patient Tolerated: Well Instructions Provided: Care of device  Donald Pore 11/22/2021, 12:28 PM
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
## 2021-11-22 NOTE — ED Notes (Signed)
Ortho to come and splint pts arm
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
## 2021-11-22 NOTE — Discharge Instructions (Addendum)
You have a fracture of your right arm.  You had a splint placed on today's visit.  You will need to keep this clean and dry.  I have provided a prescription for medication to help with pain.  Please take 1 tablet every 6 hours as needed for pain control.  Please do not drink alcohol or drive while taking this medication.  Please schedule an appointment with Dr. Frazier Butt, specialist in order to have further management of your right arm fracture.
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
## 2021-11-22 NOTE — ED Notes (Signed)
Pt called for vitals recheck. No response. °

## 2022-04-21 ENCOUNTER — Encounter (HOSPITAL_COMMUNITY): Payer: Self-pay | Admitting: Emergency Medicine

## 2022-04-21 ENCOUNTER — Other Ambulatory Visit: Payer: Self-pay

## 2022-04-21 ENCOUNTER — Emergency Department (HOSPITAL_COMMUNITY)
Admission: EM | Admit: 2022-04-21 | Discharge: 2022-04-21 | Disposition: A | Discharge status: Home / Self Care | Payer: Self-pay | Attending: Emergency Medicine | Admitting: Emergency Medicine

## 2022-04-21 ENCOUNTER — Emergency Department (HOSPITAL_COMMUNITY): Payer: Self-pay

## 2022-04-21 DIAGNOSIS — E876 Hypokalemia: Secondary | ICD-10-CM | POA: Insufficient documentation

## 2022-04-21 DIAGNOSIS — Z91040 Latex allergy status: Secondary | ICD-10-CM | POA: Insufficient documentation

## 2022-04-21 DIAGNOSIS — J40 Bronchitis, not specified as acute or chronic: Secondary | ICD-10-CM | POA: Insufficient documentation

## 2022-04-21 DIAGNOSIS — J449 Chronic obstructive pulmonary disease, unspecified: Secondary | ICD-10-CM | POA: Insufficient documentation

## 2022-04-21 DIAGNOSIS — D72829 Elevated white blood cell count, unspecified: Secondary | ICD-10-CM | POA: Insufficient documentation

## 2022-04-21 LAB — BASIC METABOLIC PANEL
Anion gap: 11 (ref 5–15)
BUN: 18 mg/dL (ref 6–20)
CO2: 25 mmol/L (ref 22–32)
Calcium: 8.9 mg/dL (ref 8.9–10.3)
Chloride: 105 mmol/L (ref 98–111)
Creatinine, Ser: 0.91 mg/dL (ref 0.44–1.00)
GFR, Estimated: >60 mL/min (ref >60)
Glucose, Bld: 105 mg/dL — ABNORMAL HIGH (ref 70–99)
Potassium: 3 mmol/L — ABNORMAL LOW (ref 3.5–5.1)
Sodium: 141 mmol/L (ref 135–145)

## 2022-04-21 LAB — CBC
HCT: 35 % — ABNORMAL LOW (ref 36.0–46.0)
Hemoglobin: 11.7 g/dL — ABNORMAL LOW (ref 12.0–15.0)
MCH: 32.9 pg (ref 26.0–34.0)
MCHC: 33.4 g/dL (ref 30.0–36.0)
MCV: 98.3 fL (ref 80.0–100.0)
Platelets: 285 10*3/uL (ref 150–400)
RBC: 3.56 MIL/uL — ABNORMAL LOW (ref 3.87–5.11)
RDW: 13.3 % (ref 11.5–15.5)
WBC: 11.5 10*3/uL — ABNORMAL HIGH (ref 4.0–10.5)
nRBC: 0 % (ref 0.0–0.2)

## 2022-04-21 LAB — TROPONIN I (HIGH SENSITIVITY): Troponin I (High Sensitivity): 2 ng/L (ref <18)

## 2022-04-21 MED ORDER — BENZONATATE 100 MG PO CAPS
200.0000 mg | ORAL_CAPSULE | Freq: Once | ORAL | Status: AC
  Administered 2022-04-21: 200 mg via ORAL
  Filled 2022-04-21: qty 2

## 2022-04-21 MED ORDER — BENZONATATE 100 MG PO CAPS: 100.0000 mg | ORAL_CAPSULE | Freq: Three times a day (TID) | ORAL | 0 refills | Status: DC

## 2022-04-21 MED ORDER — POTASSIUM CHLORIDE CRYS ER 20 MEQ PO TBCR
40.0000 meq | EXTENDED_RELEASE_TABLET | Freq: Once | ORAL | Status: AC
  Administered 2022-04-21: 40 meq via ORAL
  Filled 2022-04-21: qty 2

## 2022-04-21 MED ORDER — GUAIFENESIN ER 1200 MG PO TB12: 1.0000 | ORAL_TABLET | Freq: Two times a day (BID) | ORAL | 0 refills | Status: DC

## 2022-04-21 NOTE — ED Notes (Signed)
Patient was contacted because she left her necklace in the room. This nurse left a message with the patient's mother as she answered the number listed for the patient. This nurse labeled a bottle with a patient label and gave the necklace to security.

## 2022-05-19 ENCOUNTER — Telehealth: Payer: Self-pay

## 2022-05-19 NOTE — Telephone Encounter (Signed)
Telephoned patient at mobile number. Left [facility] scheduling information with her mother.

## 2022-05-23 ENCOUNTER — Other Ambulatory Visit: Payer: Self-pay | Admitting: Obstetrics and Gynecology

## 2022-05-23 DIAGNOSIS — Z1231 Encounter for screening mammogram for malignant neoplasm of breast: Secondary | ICD-10-CM

## 2022-05-26 ENCOUNTER — Ambulatory Visit (HOSPITAL_COMMUNITY): Payer: Self-pay

## 2022-05-26 ENCOUNTER — Inpatient Hospital Stay (HOSPITAL_COMMUNITY): Payer: Self-pay

## 2022-06-22 ENCOUNTER — Encounter: Payer: Self-pay | Admitting: Obstetrics & Gynecology

## 2022-06-29 ENCOUNTER — Encounter: Payer: Self-pay | Admitting: Obstetrics & Gynecology

## 2022-08-14 ENCOUNTER — Ambulatory Visit: Payer: Self-pay | Admitting: Obstetrics and Gynecology

## 2023-05-08 ENCOUNTER — Emergency Department (HOSPITAL_COMMUNITY): Payer: Self-pay

## 2023-05-08 ENCOUNTER — Emergency Department (HOSPITAL_COMMUNITY)
Admission: EM | Admit: 2023-05-08 | Discharge: 2023-05-08 | Disposition: A | Discharge status: Home / Self Care | Payer: Self-pay | Attending: Emergency Medicine | Admitting: Emergency Medicine

## 2023-05-08 ENCOUNTER — Encounter (HOSPITAL_COMMUNITY): Payer: Self-pay

## 2023-05-08 ENCOUNTER — Other Ambulatory Visit: Payer: Self-pay

## 2023-05-08 DIAGNOSIS — R1031 Right lower quadrant pain: Secondary | ICD-10-CM | POA: Insufficient documentation

## 2023-05-08 DIAGNOSIS — Z91040 Latex allergy status: Secondary | ICD-10-CM | POA: Insufficient documentation

## 2023-05-08 DIAGNOSIS — F17210 Nicotine dependence, cigarettes, uncomplicated: Secondary | ICD-10-CM | POA: Insufficient documentation

## 2023-05-08 DIAGNOSIS — J449 Chronic obstructive pulmonary disease, unspecified: Secondary | ICD-10-CM | POA: Insufficient documentation

## 2023-05-08 DIAGNOSIS — R1032 Left lower quadrant pain: Secondary | ICD-10-CM | POA: Insufficient documentation

## 2023-05-08 DIAGNOSIS — R1084 Generalized abdominal pain: Secondary | ICD-10-CM

## 2023-05-08 LAB — URINALYSIS, ROUTINE W REFLEX MICROSCOPIC
Bilirubin Urine: NEGATIVE
Glucose, UA: NEGATIVE mg/dL
Hgb urine dipstick: NEGATIVE
Ketones, ur: NEGATIVE mg/dL
Leukocytes,Ua: NEGATIVE
Nitrite: NEGATIVE
Protein, ur: NEGATIVE mg/dL
Specific Gravity, Urine: 1.008 (ref 1.005–1.030)
pH: 5 (ref 5.0–8.0)

## 2023-05-08 LAB — CBC
HCT: 45.2 % (ref 36.0–46.0)
Hemoglobin: 14.8 g/dL (ref 12.0–15.0)
MCH: 32.2 pg (ref 26.0–34.0)
MCHC: 32.7 g/dL (ref 30.0–36.0)
MCV: 98.5 fL (ref 80.0–100.0)
Platelets: 244 10*3/uL (ref 150–400)
RBC: 4.59 MIL/uL (ref 3.87–5.11)
RDW: 13.6 % (ref 11.5–15.5)
WBC: 12.3 10*3/uL — ABNORMAL HIGH (ref 4.0–10.5)
nRBC: 0 % (ref 0.0–0.2)

## 2023-05-08 LAB — COMPREHENSIVE METABOLIC PANEL
ALT: 17 U/L (ref 0–44)
AST: 20 U/L (ref 15–41)
Albumin: 3.6 g/dL (ref 3.5–5.0)
Alkaline Phosphatase: 72 U/L (ref 38–126)
Anion gap: 9 (ref 5–15)
BUN: 7 mg/dL (ref 6–20)
CO2: 23 mmol/L (ref 22–32)
Calcium: 9 mg/dL (ref 8.9–10.3)
Chloride: 105 mmol/L (ref 98–111)
Creatinine, Ser: 0.65 mg/dL (ref 0.44–1.00)
GFR, Estimated: >60 mL/min (ref >60)
Glucose, Bld: 99 mg/dL (ref 70–99)
Potassium: 3.8 mmol/L (ref 3.5–5.1)
Sodium: 137 mmol/L (ref 135–145)
Total Bilirubin: 0.2 mg/dL — ABNORMAL LOW (ref 0.3–1.2)
Total Protein: 6.7 g/dL (ref 6.5–8.1)

## 2023-05-08 LAB — LIPASE, BLOOD: Lipase: 30 U/L (ref 11–51)

## 2023-05-08 LAB — HCG, SERUM, QUALITATIVE: Preg, Serum: NEGATIVE

## 2023-05-08 MED ORDER — DICYCLOMINE HCL 20 MG PO TABS: 20.0000 mg | ORAL_TABLET | Freq: Two times a day (BID) | ORAL | 0 refills | Status: DC | PRN

## 2023-05-08 MED ORDER — IOHEXOL 350 MG/ML SOLN
75.0000 mL | Freq: Once | INTRAVENOUS | Status: AC | PRN
  Administered 2023-05-08: 75 mL via INTRAVENOUS
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
## 2023-05-08 NOTE — Discharge Instructions (Addendum)
We evaluated you for your abdominal pain.  Your CT scan was reassuring and did not show any dangerous process.  I am not sure what is the cause of your abdominal pain but since your testing is overall reassuring, I think it is safe to go home.  Please call [facility] to schedule appointment with a primary care doctor.  I have prescribed you a medication called Bentyl which you can try for abdominal discomfort.  If you have any worsening symptoms, vomiting, fevers, severe pain, or any other new symptoms please return for reassessment.

## 2023-05-08 NOTE — ED Triage Notes (Signed)
States she has been getting abd pain worse when she eats due to it causing her to having bloating symptoms. States generally is feeling unwell in her abd and this has been an ongoing issue but progressively worsening as time has passed. Pain is regionally based and is located in all quadrants of her abd

## 2023-05-08 NOTE — ED Notes (Signed)
Patient reports intermittent bloating with pain, reports pain in right lower back that radiates around flank and pain in the groin.Reports pain starting about 4 months ago but was not able to seek medical attention due to incarceration.

## 2023-08-29 ENCOUNTER — Other Ambulatory Visit: Payer: Self-pay

## 2023-08-29 ENCOUNTER — Emergency Department (HOSPITAL_COMMUNITY): Payer: Self-pay

## 2023-08-29 ENCOUNTER — Encounter (HOSPITAL_COMMUNITY): Payer: Self-pay | Admitting: Emergency Medicine

## 2023-08-29 ENCOUNTER — Emergency Department (HOSPITAL_COMMUNITY)
Admission: EM | Admit: 2023-08-29 | Discharge: 2023-08-29 | Disposition: A | Discharge status: Home / Self Care | Payer: Self-pay | Attending: Emergency Medicine | Admitting: Emergency Medicine

## 2023-08-29 DIAGNOSIS — R0789 Other chest pain: Secondary | ICD-10-CM | POA: Insufficient documentation

## 2023-08-29 DIAGNOSIS — L03317 Cellulitis of buttock: Secondary | ICD-10-CM

## 2023-08-29 DIAGNOSIS — Z91040 Latex allergy status: Secondary | ICD-10-CM | POA: Insufficient documentation

## 2023-08-29 DIAGNOSIS — R000 Tachycardia, unspecified: Secondary | ICD-10-CM | POA: Insufficient documentation

## 2023-08-29 DIAGNOSIS — F419 Anxiety disorder, unspecified: Secondary | ICD-10-CM | POA: Insufficient documentation

## 2023-08-29 DIAGNOSIS — R21 Rash and other nonspecific skin eruption: Secondary | ICD-10-CM | POA: Insufficient documentation

## 2023-08-29 DIAGNOSIS — D72829 Elevated white blood cell count, unspecified: Secondary | ICD-10-CM | POA: Insufficient documentation

## 2023-08-29 DIAGNOSIS — E876 Hypokalemia: Secondary | ICD-10-CM

## 2023-08-29 DIAGNOSIS — R079 Chest pain, unspecified: Secondary | ICD-10-CM

## 2023-08-29 DIAGNOSIS — J449 Chronic obstructive pulmonary disease, unspecified: Secondary | ICD-10-CM | POA: Insufficient documentation

## 2023-08-29 DIAGNOSIS — Z7951 Long term (current) use of inhaled steroids: Secondary | ICD-10-CM | POA: Insufficient documentation

## 2023-08-29 DIAGNOSIS — R109 Unspecified abdominal pain: Secondary | ICD-10-CM | POA: Insufficient documentation

## 2023-08-29 LAB — CBC WITH DIFFERENTIAL/PLATELET
Abs Immature Granulocytes: 0.13 10*3/uL — ABNORMAL HIGH (ref 0.00–0.07)
Basophils Absolute: 0.1 10*3/uL (ref 0.0–0.1)
Basophils Relative: 0 %
Eosinophils Absolute: 0.1 10*3/uL (ref 0.0–0.5)
Eosinophils Relative: 0 %
HCT: 53.4 % — ABNORMAL HIGH (ref 36.0–46.0)
Hemoglobin: 17.8 g/dL — ABNORMAL HIGH (ref 12.0–15.0)
Immature Granulocytes: 1 %
Lymphocytes Relative: 13 %
Lymphs Abs: 3 10*3/uL (ref 0.7–4.0)
MCH: 31.7 pg (ref 26.0–34.0)
MCHC: 33.3 g/dL (ref 30.0–36.0)
MCV: 95 fL (ref 80.0–100.0)
Monocytes Absolute: 2.2 10*3/uL — ABNORMAL HIGH (ref 0.1–1.0)
Monocytes Relative: 10 %
Neutro Abs: 17.5 10*3/uL — ABNORMAL HIGH (ref 1.7–7.7)
Neutrophils Relative %: 76 %
Platelets: 244 10*3/uL (ref 150–400)
RBC: 5.62 MIL/uL — ABNORMAL HIGH (ref 3.87–5.11)
RDW: 17 % — ABNORMAL HIGH (ref 11.5–15.5)
WBC: 23 10*3/uL — ABNORMAL HIGH (ref 4.0–10.5)
nRBC: 0 % (ref 0.0–0.2)

## 2023-08-29 LAB — COMPREHENSIVE METABOLIC PANEL
ALT: 15 U/L (ref 0–44)
AST: 17 U/L (ref 15–41)
Albumin: 4.4 g/dL (ref 3.5–5.0)
Alkaline Phosphatase: 84 U/L (ref 38–126)
Anion gap: 16 — ABNORMAL HIGH (ref 5–15)
BUN: 9 mg/dL (ref 6–20)
CO2: 19 mmol/L — ABNORMAL LOW (ref 22–32)
Calcium: 9.3 mg/dL (ref 8.9–10.3)
Chloride: 101 mmol/L (ref 98–111)
Creatinine, Ser: 0.57 mg/dL (ref 0.44–1.00)
GFR, Estimated: >60 mL/min (ref >60)
Glucose, Bld: 91 mg/dL (ref 70–99)
Potassium: 3.1 mmol/L — ABNORMAL LOW (ref 3.5–5.1)
Sodium: 136 mmol/L (ref 135–145)
Total Bilirubin: 0.7 mg/dL (ref 0.3–1.2)
Total Protein: 8.2 g/dL — ABNORMAL HIGH (ref 6.5–8.1)

## 2023-08-29 LAB — TROPONIN I (HIGH SENSITIVITY)
Troponin I (High Sensitivity): 2 ng/L (ref <18)
Troponin I (High Sensitivity): 2 ng/L (ref <18)

## 2023-08-29 LAB — LACTIC ACID, PLASMA: Lactic Acid, Venous: 2.1 mmol/L (ref 0.5–1.9)

## 2023-08-29 LAB — POC URINE PREG, ED: Preg Test, Ur: NEGATIVE

## 2023-08-29 MED ORDER — MORPHINE SULFATE (PF) 4 MG/ML IV SOLN
4.0000 mg | Freq: Once | INTRAVENOUS | Status: AC
Start: 2023-08-29 — End: 2023-08-29
  Administered 2023-08-29: 4 mg via INTRAVENOUS
  Filled 2023-08-29: qty 1

## 2023-08-29 MED ORDER — POTASSIUM CHLORIDE CRYS ER 20 MEQ PO TBCR
40.0000 meq | EXTENDED_RELEASE_TABLET | Freq: Once | ORAL | Status: AC
  Administered 2023-08-29: 40 meq via ORAL
  Filled 2023-08-29: qty 2

## 2023-08-29 MED ORDER — OXYCODONE-ACETAMINOPHEN 5-325 MG PO TABS
1.0000 | ORAL_TABLET | Freq: Four times a day (QID) | ORAL | 0 refills | Status: AC | PRN
Start: 2023-08-29 — End: ?

## 2023-08-29 MED ORDER — OXYCODONE-ACETAMINOPHEN 5-325 MG PO TABS
1.0000 | ORAL_TABLET | Freq: Once | ORAL | Status: AC
Start: 2023-08-29 — End: 2023-08-29
  Administered 2023-08-29: 1 via ORAL
  Filled 2023-08-29: qty 1

## 2023-08-29 MED ORDER — CEPHALEXIN 500 MG PO CAPS: 500.0000 mg | ORAL_CAPSULE | Freq: Four times a day (QID) | ORAL | 0 refills | Status: AC

## 2023-08-29 MED ORDER — SODIUM CHLORIDE 0.9 % IV SOLN
1.0000 g | Freq: Once | INTRAVENOUS | Status: AC
  Administered 2023-08-29: 1 g via INTRAVENOUS
  Filled 2023-08-29: qty 10

## 2023-08-29 NOTE — Discharge Instructions (Signed)
You were seen in the emergency department for painful lesion on your right buttock.  You were given IV antibiotics and pain medication with some improvement in your symptoms.  We have sent prescriptions off to your pharmacy for antibiotics and pain medicine.  This area may develop into an abscess so if you are not improving in the next day or 2 please return to the emergency department for further evaluation.

## 2023-08-29 NOTE — ED Provider Notes (Signed)
[place] EMERGENCY DEPARTMENT AT [facility] Provider Note   CSN: [id number] Arrival date & time: 08/29/23  1848     History {Add pertinent medical, surgical, social history, OB history to HPI:1} Chief Complaint  Patient presents with   Chest Pain   Abscess    Leslie Christian is a 47 y.o. female.  She has a history of chronic pain, shortness of breath, COPD.  Complaining of pain in her right buttock that started yesterday.  Becoming more severe and radiating into back and posterior thigh.  Feeling feverish.  Starting last evening began experiencing some discomfort in her chest and her upper abdomen.  Feeling short of breath.  Symptoms worsened today and so came here for further evaluation.  She does not recall any type of bite or trauma to her buttock area.  She denies any IV injections in that area.  She does have a history of cocaine use but denies any heroin use or other opiate use.  The history is provided by the patient.  Rash Location:  Torso and pelvis Pelvic rash location:  R buttock Quality: painful and redness   Pain details:    Quality:  Aching   Severity:  Severe   Onset quality:  Gradual   Duration:  2 days   Timing:  Constant   Progression:  Worsening Chronicity:  New Relieved by:  Nothing Associated symptoms: abdominal pain, fever and shortness of breath   Associated symptoms: no nausea and not vomiting        Home Medications Prior to Admission medications   Medication Sig Start Date End Date Taking? Authorizing Provider  albuterol (PROVENTIL HFA;VENTOLIN HFA) 108 (90 Base) MCG/ACT inhaler Inhale 2 puffs into the lungs every 6 (six) hours as needed for wheezing or shortness of breath.    [provider]  benzonatate (TESSALON) 100 MG capsule Take 1 capsule (100 mg total) by mouth every 8 (eight) hours. 04/21/22   Linwood Dibbles, MD  dicyclomine (BENTYL) 20 MG tablet Take 1 tablet (20 mg total) by mouth 2 (two) times daily as needed (abdominal  discomfort). 05/08/23   Lonell Grandchild, MD  Guaifenesin 1200 MG TB12 Take 1 tablet (1,200 mg total) by mouth 2 (two) times daily at 10 AM and 5 PM. 04/21/22   Linwood Dibbles, MD  ibuprofen (ADVIL,MOTRIN) 200 MG tablet Take 400 mg by mouth every 6 (six) hours as needed for mild pain or moderate pain.    [provider]      Allergies    Latex    Review of Systems   Review of Systems  Constitutional:  Positive for fever.  Respiratory:  Positive for shortness of breath.   Cardiovascular:  Positive for chest pain.  Gastrointestinal:  Positive for abdominal pain. Negative for nausea and vomiting.  Genitourinary:  Negative for dysuria.  Skin:  Positive for rash.    Physical Exam Updated Vital Signs BP 115/78   Pulse 97   Temp 98.8 F (37.1 C) (Oral)   Resp 20   Ht 5\' 4"  (1.626 m)   Wt 54.4 kg   SpO2 96%   BMI 20.60 kg/m  Physical Exam Vitals and nursing note reviewed.  Constitutional:      General: She is not in acute distress.    Appearance: She is well-developed.  HENT:     Head: Normocephalic and atraumatic.  Eyes:     Conjunctiva/sclera: Conjunctivae normal.  Cardiovascular:     Rate and Rhythm: Normal rate and  regular rhythm.     Heart sounds: Normal heart sounds. No murmur heard. Pulmonary:     Effort: Pulmonary effort is normal. No respiratory distress.     Breath sounds: Normal breath sounds.  Abdominal:     Palpations: Abdomen is soft.     Tenderness: There is no abdominal tenderness. There is no guarding or rebound.  Musculoskeletal:        General: No swelling. Normal range of motion.     Cervical back: Neck supple.  Skin:    General: Skin is warm and dry.     Capillary Refill: Capillary refill takes less than 2 seconds.     Findings: Rash present.     Comments: She has an area of cellulitis approximately 10 cm with about 4 cm of central induration.  I do not feel any obvious fluctuance.  There is a small skin defect in the center possibly bite.   Neurological:     General: No focal deficit present.     Mental Status: She is alert.     ED Results / Procedures / Treatments   Labs (all labs ordered are listed, but only abnormal results are displayed) Labs Reviewed  CULTURE, BLOOD (ROUTINE X 2)  CULTURE, BLOOD (ROUTINE X 2)  COMPREHENSIVE METABOLIC PANEL  CBC WITH DIFFERENTIAL/PLATELET  LACTIC ACID, PLASMA  POC URINE PREG, ED  TROPONIN I (HIGH SENSITIVITY)    EKG EKG Interpretation Date/Time:  Wednesday August 29 2023 19:04:17 EDT Ventricular Rate:  110 PR Interval:  148 QRS Duration:  88 QT Interval:  342 QTC Calculation: 462 R Axis:   80  Text Interpretation: Sinus tachycardia Right atrial enlargement Borderline ECG When compared with ECG of 21-Apr-2022 08:26, No significant change since prior 6/23 Confirmed by Meridee Score [phone]) on 08/29/2023 7:11:42 PM  Radiology No results found.  Procedures Procedures  {Document cardiac monitor, telemetry assessment procedure when appropriate:1}  Medications Ordered in ED Medications  morphine (PF) 4 MG/ML injection 4 mg (has no administration in time range)  cefTRIAXone (ROCEPHIN) 1 g in sodium chloride 0.9 % 100 mL IVPB (has no administration in time range)    ED Course/ Medical Decision Making/ A&P   {   Click here for ABCD2, HEART and other calculatorsREFRESH Note before signing :1}                              Medical Decision Making Amount and/or Complexity of Data Reviewed Labs: ordered. Radiology: ordered.  Risk Prescription drug management.   This patient complains of ***; this involves an extensive number of treatment Options and is a complaint that carries with it a high risk of complications and morbidity. The differential includes ***  I ordered, reviewed and interpreted labs, which included *** I ordered medication *** and reviewed PMP when indicated. I ordered imaging studies which included *** and I independently    visualized and  interpreted imaging which showed *** Additional history obtained from *** Previous records obtained and reviewed *** I consulted *** and discussed lab and imaging findings and discussed disposition.  Cardiac monitoring reviewed, *** Social determinants considered, *** Critical Interventions: ***  After the interventions stated above, I reevaluated the patient and found *** Admission and further testing considered, ***   {Document critical care time when appropriate:1} {Document review of labs and clinical decision tools ie heart score, Chads2Vasc2 etc:1}  {Document your independent review of radiology images, and any outside records:1} {Document  your discussion with family members, caretakers, and with consultants:1} {Document social determinants of health affecting pt's care:1} {Document your decision making why or why not admission, treatments were needed:1} Final Clinical Impression(s) / ED Diagnoses Final diagnoses:  None    Rx / DC Orders ED Discharge Orders     None

## 2023-08-29 NOTE — ED Triage Notes (Signed)
Pt very tearful in triage, states she having CP, joint pain, has circular abscess to right buttock.

## 2023-09-03 LAB — CULTURE, BLOOD (ROUTINE X 2)
Culture: NO GROWTH
Culture: NO GROWTH
Special Requests: ADEQUATE

## 2023-12-16 ENCOUNTER — Emergency Department (HOSPITAL_COMMUNITY): Payer: Self-pay

## 2023-12-16 ENCOUNTER — Encounter (HOSPITAL_COMMUNITY): Payer: Self-pay | Admitting: *Deleted

## 2023-12-16 ENCOUNTER — Other Ambulatory Visit: Payer: Self-pay

## 2023-12-16 ENCOUNTER — Emergency Department (HOSPITAL_COMMUNITY)
Admission: EM | Admit: 2023-12-16 | Discharge: 2023-12-16 | Disposition: A | Discharge status: Home / Self Care | Payer: Self-pay | Attending: Emergency Medicine | Admitting: Emergency Medicine

## 2023-12-16 DIAGNOSIS — J449 Chronic obstructive pulmonary disease, unspecified: Secondary | ICD-10-CM | POA: Insufficient documentation

## 2023-12-16 DIAGNOSIS — R0789 Other chest pain: Secondary | ICD-10-CM

## 2023-12-16 DIAGNOSIS — Z91040 Latex allergy status: Secondary | ICD-10-CM | POA: Insufficient documentation

## 2023-12-16 DIAGNOSIS — D72829 Elevated white blood cell count, unspecified: Secondary | ICD-10-CM | POA: Insufficient documentation

## 2023-12-16 DIAGNOSIS — J45909 Unspecified asthma, uncomplicated: Secondary | ICD-10-CM | POA: Insufficient documentation

## 2023-12-16 DIAGNOSIS — K130 Diseases of lips: Secondary | ICD-10-CM

## 2023-12-16 LAB — CBC
HCT: 47 % — ABNORMAL HIGH (ref 36.0–46.0)
Hemoglobin: 16.3 g/dL — ABNORMAL HIGH (ref 12.0–15.0)
MCH: 34 pg (ref 26.0–34.0)
MCHC: 34.7 g/dL (ref 30.0–36.0)
MCV: 98.1 fL (ref 80.0–100.0)
Platelets: 241 10*3/uL (ref 150–400)
RBC: 4.79 MIL/uL (ref 3.87–5.11)
RDW: 13.3 % (ref 11.5–15.5)
WBC: 12.7 10*3/uL — ABNORMAL HIGH (ref 4.0–10.5)
nRBC: 0 % (ref 0.0–0.2)

## 2023-12-16 LAB — BASIC METABOLIC PANEL
Anion gap: 12 (ref 5–15)
BUN: 9 mg/dL (ref 6–20)
CO2: 21 mmol/L — ABNORMAL LOW (ref 22–32)
Calcium: 9.3 mg/dL (ref 8.9–10.3)
Chloride: 104 mmol/L (ref 98–111)
Creatinine, Ser: 0.49 mg/dL (ref 0.44–1.00)
GFR, Estimated: >60 mL/min (ref >60)
Glucose, Bld: 100 mg/dL — ABNORMAL HIGH (ref 70–99)
Potassium: 3.7 mmol/L (ref 3.5–5.1)
Sodium: 137 mmol/L (ref 135–145)

## 2023-12-16 LAB — TROPONIN I (HIGH SENSITIVITY)
Troponin I (High Sensitivity): <2 ng/L (ref <18)
Troponin I (High Sensitivity): <2 ng/L (ref <18)

## 2023-12-16 MED ORDER — SULFAMETHOXAZOLE-TRIMETHOPRIM 800-160 MG PO TABS: 1.0000 | ORAL_TABLET | Freq: Two times a day (BID) | ORAL | 0 refills | Status: AC

## 2023-12-16 MED ORDER — SULFAMETHOXAZOLE-TRIMETHOPRIM 800-160 MG PO TABS
1.0000 | ORAL_TABLET | Freq: Once | ORAL | Status: AC
  Administered 2023-12-16: 1 via ORAL
  Filled 2023-12-16: qty 1

## 2023-12-16 NOTE — Discharge Instructions (Signed)
 Thank you for coming to [facility] Emergency Department. We did an exam, labs, and imaging, and these showed an infection in your lip. We have provided bactrim to take twice per day for 7 days. You also reported chest pain. Please make an appointment to follow up with a cardiologist within 1 week.   Please follow up with your primary care provider within 1 week.   Do not hesitate to return to the ED or call 911 if you experience: -Worsening symptoms -Shortness of breath -Worsening chest pain -Neck or throat swelling -Lightheadedness, passing out -Fevers/chills -Anything else that concerns you

## 2023-12-16 NOTE — ED Notes (Signed)
 Patient discharged. Provider spoke to patient. Paperwork given to patient and reviewed. Pt verbalized understanding. VSS. A+Ox4. Patient ambulated out of the ER with steady, independent gait. No IV in place.

## 2023-12-16 NOTE — ED Triage Notes (Signed)
 Pt with sore to left upper lip, painful and swelling, first noted yesterday.  States pain radiates to left cheek of face.  Pt is able to swallow.   Pt with mid CP and mild SOB-no distress noted in triage, pt able to speak complete sentences.

## 2023-12-16 NOTE — ED Provider Notes (Addendum)
 [place] [facility] AT [facility] Provider Note   CSN: [id number] Arrival date & time: 12/16/23  1736     History  Chief Complaint  Patient presents with   Chest Pain    Leslie Christian is a 48 y.o. female with PMH as listed below who presents with lip swelling, chest pain.   Pt with redness/sore to left upper lip, painful and swelling, first noted yesterday.  States pain radiates to left cheek of face.  Pt is able to swallow, no neck or tongue symptoms.    Today, patient has had with mid CP and mild SOB, associated w/ nausea without vomiting and diaphoresis. Feels similar to prior episodes of "angina" but feels worse this time. Not exerting herself when it started. Intermittent, comes and goes, not currently having pain. Denies drug use.   Past Medical History:  Diagnosis Date   Anxiety    Asthma    Atrial fibrillation (HCC)    Chronic back pain    Chronic neck pain    COPD (chronic obstructive pulmonary disease) (HCC)    Migraine headache    Mitral valve prolapse    Polysubstance abuse (HCC)    Hx of cocaine, opiates, marijuana use   Seizures (HCC)    Last seizure 9/16       Home Medications Prior to Admission medications   Medication Sig Start Date End Date Taking? Authorizing Provider  sulfamethoxazole-trimethoprim (BACTRIM DS) 800-160 MG tablet Take 1 tablet by mouth 2 (two) times daily for 7 days. 12/16/23 12/23/23 Yes Loetta Rough, MD  acetaminophen (TYLENOL) 325 MG tablet Take 775 mg by mouth every 6 (six) hours as needed for mild pain (pain score 1-3).    [provider]  albuterol (PROVENTIL HFA;VENTOLIN HFA) 108 (90 Base) MCG/ACT inhaler Inhale 2 puffs into the lungs every 6 (six) hours as needed for wheezing or shortness of breath.    [provider]  cephALEXin (KEFLEX) 500 MG capsule Take 1 capsule (500 mg total) by mouth 4 (four) times daily. 08/29/23   Terrilee Files, MD  ibuprofen (ADVIL,MOTRIN) 200 MG tablet  Take 400 mg by mouth every 6 (six) hours as needed for mild pain or moderate pain.    [provider]  oxyCODONE-acetaminophen (PERCOCET/ROXICET) 5-325 MG tablet Take 1 tablet by mouth every 6 (six) hours as needed for severe pain (pain score 7-10). 08/29/23   Terrilee Files, MD      Allergies    Latex    Review of Systems   Review of Systems A 10 point review of systems was performed and is negative unless otherwise reported in HPI.  Physical Exam Updated Vital Signs BP (!) 146/90 (BP Location: Right Arm)   Pulse 71   Temp 97.7 F (36.5 C) (Oral)   Resp 20   Ht 5\' 4"  (1.626 m)   Wt 52.2 kg   SpO2 100%   BMI 19.74 kg/m  Physical Exam General: Normal appearing female, lying in bed.  HEENT: PERRLA, EOMI, Sclera anicteric, MMM, trachea midline. 1x1 cm area of erythema with pinpoint scab overlying right upper vermillion border of the lip. TTP and mild swelling surrounding. No areas of fluctuance. No crepitus, no areas of fluctuance. No submandibular or periorbital swelling. Clear oropharynx.  Cardiology: RRR, no murmurs/rubs/gallops. BL radial and DP pulses equal bilaterally.  Resp: Normal respiratory rate and effort. CTAB, no wheezes, rhonchi, crackles.  Abd: Soft, non-tender, non-distended. No rebound tenderness or guarding.  GU: Deferred. MSK:  No peripheral edema or signs of trauma. Extremities without deformity or TTP.  Skin: warm, dry.  Neuro: A&Ox4, CNs II-XII grossly intact. MAEs. Sensation grossly intact.  Psych: Normal mood and affect.   ED Results / Procedures / Treatments   Labs (all labs ordered are listed, but only abnormal results are displayed) Labs Reviewed  BASIC METABOLIC PANEL - Abnormal; Notable for the following components:      Result Value   CO2 21 (*)    Glucose, Bld 100 (*)    All other components within normal limits  CBC - Abnormal; Notable for the following components:   WBC 12.7 (*)    Hemoglobin 16.3 (*)    HCT 47.0 (*)    All  other components within normal limits  TROPONIN I (HIGH SENSITIVITY)  TROPONIN I (HIGH SENSITIVITY)    EKG EKG Interpretation Date/Time:  Sunday December 16 2023 17:46:37 EST Ventricular Rate:  78 PR Interval:  156 QRS Duration:  74 QT Interval:  408 QTC Calculation: 465 R Axis:   82  Text Interpretation: Normal sinus rhythm Septal infarct , age undetermined baseline wander makes interpretation difficult Confirmed by Vivi Barrack [phone]) on 12/16/2023 7:11:59 PM  Radiology DG Chest 2 View Result Date: 12/16/2023 CLINICAL DATA:  Chest pain EXAM: CHEST - 2 VIEW COMPARISON:  August 29, 2023 FINDINGS: The cardiomediastinal silhouette is normal in contour. No pleural effusion. No pneumothorax. Similar appearance of diffuse bronchitic markings. No acute pleuroparenchymal abnormality. Visualized abdomen is unremarkable. No acute osseous abnormality noted. IMPRESSION: Similar appearance of diffuse bronchitic markings likely reflecting underlying small airways disease. No acute cardiopulmonary abnormality noted. Electronically Signed   By: Meda Klinefelter M.D.   On: 12/16/2023 18:17    Procedures Procedures    Medications Ordered in ED Medications  sulfamethoxazole-trimethoprim (BACTRIM DS) 800-160 MG per tablet 1 tablet (has no administration in time range)    ED Course/ Medical Decision Making/ A&P                          Medical Decision Making Amount and/or Complexity of Data Reviewed Labs: ordered. Decision-making details documented in ED Course. Radiology: ordered. Decision-making details documented in ED Course.  Risk Prescription drug management.    This patient presents to the ED for concern of lip infection, chest pain, this involves an extensive number of treatment options, and is a complaint that carries with it a high risk of complications and morbidity.  I considered the following differential and admission for this acute, potentially life threatening condition.    MDM:    DDX for chest pain includes but is not limited to:  Consider ACS or unstable angina given patient's report that it felt like her previous "angina" but was worse this time. She is reassuringly without pain and EKG without signs of ischemia. Will obtain troponins and CTM, will d/w patient. She has no cardiology records visible here. States she has never received a stress test. She states unsolicited that she thinks it may be anxiety related to what is happening with her lip. She is able to [facility] out for PE. CXR doesn't show any pulm edema/pleural effusion/PTX/PNA. Possible bronchitis but no acute findings, and she doesn't have any cough or wheezing. No c/f dissection. No abdominal pain and no c/f biliary disease.   For her lip infection, consider small area of cellulitis. No abscess large enough for drainage. Possible origin was ingrown hair or pustule/acne. No signs of sepsis, patient non-toxic  appearing. No large area of facial swelling to indicate facial abscess. Clear oropharynx, no submandibular/oral/pharyngeal swelling. Will give bactrim for cellulitis.    Clinical Course as of 12/16/23 2225  Sun Dec 16, 2023  1910 WBC(!): 12.7 +leukocytosis, which patient does have chronically [HN]  1911 DG Chest 2 View Similar appearance of diffuse bronchitic markings likely reflecting underlying small airways disease. No acute cardiopulmonary abnormality noted.   [HN]  2133 Troponin I (High Sensitivity): <2 neg [HN]  2216 Patient with no chest pain without any treatment. She states it has been years since she has seen a cardiologist. I offered patient admission for chest pain workup including stress test but patient prefers to follow up outpatient as opposed to an admission. She understands the risks of leaving. Will refer her to cardiology within 1 week. Her heart score is 4 for history, age, risk factors; patient understands to return for worsening chest pain or fevers/chills, worsening  infection. Will give bactrim rx for lip/facial cellulitis. DC w/ discharge instructions/return precautions. All questions answered to patient's satisfaction.   [HN]    Clinical Course User Index [HN] Loetta Rough, MD    Labs: I Ordered, and personally interpreted labs.  The pertinent results include:  those listed above  Imaging Studies ordered: I ordered imaging studies including CXR I independently visualized and interpreted imaging. I agree with the radiologist interpretation  Additional history obtained from chart review.   Reevaluation: After the interventions noted above, I reevaluated the patient and found that they have :improved  Social Determinants of Health: Lives independently  Disposition:  DC  Co morbidities that complicate the patient evaluation  Past Medical History:  Diagnosis Date   Anxiety    Asthma    Atrial fibrillation (HCC)    Chronic back pain    Chronic neck pain    COPD (chronic obstructive pulmonary disease) (HCC)    Migraine headache    Mitral valve prolapse    Polysubstance abuse (HCC)    Hx of cocaine, opiates, marijuana use   Seizures (HCC)    Last seizure 9/16     Medicines Meds ordered this encounter  Medications   sulfamethoxazole-trimethoprim (BACTRIM DS) 800-160 MG per tablet 1 tablet   sulfamethoxazole-trimethoprim (BACTRIM DS) 800-160 MG tablet    Sig: Take 1 tablet by mouth 2 (two) times daily for 7 days.    Dispense:  14 tablet    Refill:  0    I have reviewed the patients home medicines and have made adjustments as needed  Problem List / ED Course: Problem List Items Addressed This Visit   None Visit Diagnoses       Atypical chest pain    -  Primary   Relevant Orders   Ambulatory referral to Cardiology     Cellulitis of lip               Loetta Rough, MD 12/16/23 2225

## 2024-03-19 ENCOUNTER — Ambulatory Visit: Payer: Self-pay | Attending: Internal Medicine | Admitting: Internal Medicine

## 2024-03-19 NOTE — Progress Notes (Signed)
 Erroneous encounter - please disregard.
